# Patient Record
Sex: Male | Born: 1955 | Race: Black or African American | Hispanic: No | Marital: Single | Smoking: Former smoker
Health system: Southern US, Community
[De-identification: ages and names within clinical notes are randomized; demographics above are authoritative.]

## PROBLEM LIST (undated history)

## (undated) DIAGNOSIS — E119 Type 2 diabetes mellitus without complications: Secondary | ICD-10-CM

## (undated) DIAGNOSIS — K219 Gastro-esophageal reflux disease without esophagitis: Secondary | ICD-10-CM

## (undated) DIAGNOSIS — J302 Other seasonal allergic rhinitis: Secondary | ICD-10-CM

## (undated) DIAGNOSIS — Z8781 Personal history of (healed) traumatic fracture: Secondary | ICD-10-CM

## (undated) DIAGNOSIS — N400 Enlarged prostate without lower urinary tract symptoms: Secondary | ICD-10-CM

## (undated) DIAGNOSIS — F1911 Other psychoactive substance abuse, in remission: Secondary | ICD-10-CM

## (undated) DIAGNOSIS — N529 Male erectile dysfunction, unspecified: Secondary | ICD-10-CM

## (undated) DIAGNOSIS — M1990 Unspecified osteoarthritis, unspecified site: Secondary | ICD-10-CM

## (undated) DIAGNOSIS — G8929 Other chronic pain: Secondary | ICD-10-CM

## (undated) DIAGNOSIS — Z972 Presence of dental prosthetic device (complete) (partial): Secondary | ICD-10-CM

## (undated) DIAGNOSIS — D573 Sickle-cell trait: Secondary | ICD-10-CM

## (undated) DIAGNOSIS — Z8719 Personal history of other diseases of the digestive system: Secondary | ICD-10-CM

## (undated) DIAGNOSIS — M549 Dorsalgia, unspecified: Secondary | ICD-10-CM

## (undated) DIAGNOSIS — Z9229 Personal history of other drug therapy: Secondary | ICD-10-CM

## (undated) DIAGNOSIS — H409 Unspecified glaucoma: Secondary | ICD-10-CM

## (undated) DIAGNOSIS — E785 Hyperlipidemia, unspecified: Secondary | ICD-10-CM

## (undated) DIAGNOSIS — Z86010 Personal history of colonic polyps: Secondary | ICD-10-CM

## (undated) DIAGNOSIS — Z832 Family history of diseases of the blood and blood-forming organs and certain disorders involving the immune mechanism: Secondary | ICD-10-CM

## (undated) DIAGNOSIS — M5030 Other cervical disc degeneration, unspecified cervical region: Secondary | ICD-10-CM

## (undated) DIAGNOSIS — K642 Third degree hemorrhoids: Secondary | ICD-10-CM

## (undated) DIAGNOSIS — F1021 Alcohol dependence, in remission: Secondary | ICD-10-CM

## (undated) DIAGNOSIS — D509 Iron deficiency anemia, unspecified: Secondary | ICD-10-CM

## (undated) DIAGNOSIS — Z860101 Personal history of adenomatous and serrated colon polyps: Secondary | ICD-10-CM

## (undated) DIAGNOSIS — K08109 Complete loss of teeth, unspecified cause, unspecified class: Secondary | ICD-10-CM

## (undated) DIAGNOSIS — I10 Essential (primary) hypertension: Secondary | ICD-10-CM

## (undated) DIAGNOSIS — Z8669 Personal history of other diseases of the nervous system and sense organs: Secondary | ICD-10-CM

## (undated) HISTORY — DX: Gastro-esophageal reflux disease without esophagitis: K21.9

## (undated) HISTORY — PX: APPENDECTOMY: SHX54

## (undated) HISTORY — PX: ESOPHAGOGASTRODUODENOSCOPY: SHX1529

## (undated) HISTORY — DX: Type 2 diabetes mellitus without complications: E11.9

## (undated) HISTORY — PX: COLONOSCOPY: SHX174

## (undated) HISTORY — DX: Unspecified osteoarthritis, unspecified site: M19.90

---

## 1999-11-08 ENCOUNTER — Emergency Department (HOSPITAL_COMMUNITY): Admission: EM | Admit: 1999-11-08 | Discharge: 1999-11-08 | Payer: Self-pay | Admitting: Emergency Medicine

## 1999-11-08 ENCOUNTER — Encounter: Payer: Self-pay | Admitting: Emergency Medicine

## 2001-05-09 ENCOUNTER — Emergency Department (HOSPITAL_COMMUNITY): Admission: EM | Admit: 2001-05-09 | Discharge: 2001-05-09 | Payer: Self-pay | Admitting: Emergency Medicine

## 2005-01-16 ENCOUNTER — Emergency Department (HOSPITAL_COMMUNITY): Admission: EM | Admit: 2005-01-16 | Discharge: 2005-01-16 | Payer: Self-pay | Admitting: Emergency Medicine

## 2007-01-16 HISTORY — PX: OTHER SURGICAL HISTORY: SHX169

## 2007-07-16 DIAGNOSIS — Z8781 Personal history of (healed) traumatic fracture: Secondary | ICD-10-CM

## 2007-07-16 HISTORY — DX: Personal history of (healed) traumatic fracture: Z87.81

## 2007-07-27 ENCOUNTER — Emergency Department (HOSPITAL_COMMUNITY): Admission: EM | Admit: 2007-07-27 | Discharge: 2007-07-27 | Payer: Self-pay | Admitting: Emergency Medicine

## 2007-08-04 ENCOUNTER — Emergency Department (HOSPITAL_COMMUNITY): Admission: EM | Admit: 2007-08-04 | Discharge: 2007-08-04 | Payer: Self-pay | Admitting: Emergency Medicine

## 2007-08-05 ENCOUNTER — Emergency Department (HOSPITAL_COMMUNITY): Admission: EM | Admit: 2007-08-05 | Discharge: 2007-08-05 | Payer: Self-pay | Admitting: Emergency Medicine

## 2007-08-05 ENCOUNTER — Ambulatory Visit (HOSPITAL_COMMUNITY): Admission: RE | Admit: 2007-08-05 | Discharge: 2007-08-05 | Payer: Self-pay | Admitting: Specialist

## 2007-08-19 ENCOUNTER — Emergency Department (HOSPITAL_COMMUNITY): Admission: EM | Admit: 2007-08-19 | Discharge: 2007-08-19 | Payer: Self-pay | Admitting: Emergency Medicine

## 2007-09-03 ENCOUNTER — Encounter: Payer: Self-pay | Admitting: Physician Assistant

## 2007-09-03 ENCOUNTER — Emergency Department (HOSPITAL_COMMUNITY): Admission: EM | Admit: 2007-09-03 | Discharge: 2007-09-03 | Payer: Self-pay | Admitting: Emergency Medicine

## 2007-09-03 LAB — CONVERTED CEMR LAB
Basophils Relative: 1 %
Eosinophils Relative: 1 %
HCT: 36 %
Hemoglobin: 11.5 g/dL
Lymphocytes Relative: 32 %
MCHC: 31.9 g/dL
MCV: 71.4 fL
Monocytes Relative: 6 %
Neutrophils Relative %: 60 %
Platelets: 222 10*3/uL
RBC: 5.04 M/uL
RDW: 17.8 %
WBC: 8 10*3/uL

## 2008-09-24 ENCOUNTER — Ambulatory Visit: Payer: Self-pay | Admitting: Physician Assistant

## 2008-09-24 DIAGNOSIS — K047 Periapical abscess without sinus: Secondary | ICD-10-CM | POA: Insufficient documentation

## 2008-09-24 DIAGNOSIS — S0230XA Fracture of orbital floor, unspecified side, initial encounter for closed fracture: Secondary | ICD-10-CM | POA: Insufficient documentation

## 2008-09-24 DIAGNOSIS — M479 Spondylosis, unspecified: Secondary | ICD-10-CM | POA: Insufficient documentation

## 2008-09-24 DIAGNOSIS — D179 Benign lipomatous neoplasm, unspecified: Secondary | ICD-10-CM | POA: Insufficient documentation

## 2008-09-24 DIAGNOSIS — Z8669 Personal history of other diseases of the nervous system and sense organs: Secondary | ICD-10-CM | POA: Insufficient documentation

## 2008-09-24 DIAGNOSIS — R079 Chest pain, unspecified: Secondary | ICD-10-CM | POA: Insufficient documentation

## 2008-09-24 DIAGNOSIS — Z9089 Acquired absence of other organs: Secondary | ICD-10-CM | POA: Insufficient documentation

## 2008-09-27 ENCOUNTER — Telehealth: Payer: Self-pay | Admitting: Physician Assistant

## 2008-09-28 ENCOUNTER — Encounter: Payer: Self-pay | Admitting: Physician Assistant

## 2008-09-28 ENCOUNTER — Telehealth: Payer: Self-pay | Admitting: Physician Assistant

## 2008-09-28 LAB — CONVERTED CEMR LAB
ALT: 12 units/L (ref 0–53)
AST: 13 units/L (ref 0–37)
Albumin: 4.6 g/dL (ref 3.5–5.2)
Alkaline Phosphatase: 78 units/L (ref 39–117)
BUN: 12 mg/dL (ref 6–23)
Basophils Absolute: 0 10*3/uL (ref 0.0–0.1)
Basophils Relative: 0 % (ref 0–1)
CO2: 23 meq/L (ref 19–32)
Calcium: 9.3 mg/dL (ref 8.4–10.5)
Chloride: 106 meq/L (ref 96–112)
Cholesterol: 198 mg/dL (ref 0–200)
Creatinine, Ser: 0.75 mg/dL (ref 0.40–1.50)
Eosinophils Absolute: 0.1 10*3/uL (ref 0.0–0.7)
Eosinophils Relative: 1 % (ref 0–5)
Glucose, Bld: 87 mg/dL (ref 70–99)
HCT: 33.2 % — ABNORMAL LOW (ref 39.0–52.0)
HDL: 46 mg/dL (ref >39)
Hemoglobin: 11.6 g/dL — ABNORMAL LOW (ref 13.0–17.0)
LDL Cholesterol: 124 mg/dL — ABNORMAL HIGH (ref 0–99)
Lymphocytes Relative: 21 % (ref 12–46)
Lymphs Abs: 3.1 10*3/uL (ref 0.7–4.0)
MCHC: 34.9 g/dL (ref 30.0–36.0)
MCV: 60.7 fL — ABNORMAL LOW (ref 78.0–100.0)
Monocytes Absolute: 1.1 10*3/uL — ABNORMAL HIGH (ref 0.1–1.0)
Monocytes Relative: 7 % (ref 3–12)
Neutro Abs: 10.1 10*3/uL — ABNORMAL HIGH (ref 1.7–7.7)
Neutrophils Relative %: 71 % (ref 43–77)
PSA: 0.59 ng/mL (ref 0.10–4.00)
Platelets: 212 10*3/uL (ref 150–400)
Potassium: 4.3 meq/L (ref 3.5–5.3)
RBC: 5.47 M/uL (ref 4.22–5.81)
RDW: 17 % — ABNORMAL HIGH (ref 11.5–15.5)
Sodium: 143 meq/L (ref 135–145)
TSH: 0.844 microintl units/mL (ref 0.350–4.500)
Total Bilirubin: 1 mg/dL (ref 0.3–1.2)
Total CHOL/HDL Ratio: 4.3
Total Protein: 7.5 g/dL (ref 6.0–8.3)
Triglycerides: 140 mg/dL (ref <150)
VLDL: 28 mg/dL (ref 0–40)
WBC: 14.4 10*3/uL — ABNORMAL HIGH (ref 4.0–10.5)

## 2008-09-29 ENCOUNTER — Ambulatory Visit: Payer: Self-pay | Admitting: Physician Assistant

## 2008-09-30 ENCOUNTER — Encounter: Payer: Self-pay | Admitting: Physician Assistant

## 2008-10-15 DIAGNOSIS — D582 Other hemoglobinopathies: Secondary | ICD-10-CM | POA: Insufficient documentation

## 2008-10-15 LAB — CONVERTED CEMR LAB
Iron: 85 ug/dL (ref 42–165)
Retic Ct Pct: 1.4 % (ref 0.4–3.1)
Saturation Ratios: 32 % (ref 20–55)
TIBC: 267 ug/dL (ref 215–435)
UIBC: 182 ug/dL

## 2008-10-25 ENCOUNTER — Ambulatory Visit: Payer: Self-pay | Admitting: Physician Assistant

## 2008-10-25 DIAGNOSIS — N400 Enlarged prostate without lower urinary tract symptoms: Secondary | ICD-10-CM | POA: Insufficient documentation

## 2008-10-25 LAB — CONVERTED CEMR LAB
Bilirubin Urine: NEGATIVE
Blood in Urine, dipstick: NEGATIVE
Glucose, Urine, Semiquant: NEGATIVE
Ketones, urine, test strip: NEGATIVE
Nitrite: NEGATIVE
Protein, U semiquant: NEGATIVE
Specific Gravity, Urine: >=1.03
Urobilinogen, UA: 0.2
WBC Urine, dipstick: NEGATIVE
pH: 5

## 2008-10-27 ENCOUNTER — Encounter: Payer: Self-pay | Admitting: Physician Assistant

## 2008-10-28 ENCOUNTER — Telehealth: Payer: Self-pay | Admitting: Physician Assistant

## 2008-10-29 ENCOUNTER — Encounter (INDEPENDENT_AMBULATORY_CARE_PROVIDER_SITE_OTHER): Payer: Self-pay | Admitting: General Surgery

## 2008-10-29 ENCOUNTER — Ambulatory Visit (HOSPITAL_COMMUNITY): Admission: RE | Admit: 2008-10-29 | Discharge: 2008-10-29 | Payer: Self-pay | Admitting: General Surgery

## 2008-10-29 HISTORY — PX: LIPOMA EXCISION: SHX5283

## 2008-11-01 ENCOUNTER — Telehealth: Payer: Self-pay | Admitting: Physician Assistant

## 2008-11-02 ENCOUNTER — Encounter: Payer: Self-pay | Admitting: Physician Assistant

## 2008-11-15 ENCOUNTER — Encounter: Admission: RE | Admit: 2008-11-15 | Discharge: 2008-12-27 | Payer: Self-pay | Admitting: Physician Assistant

## 2008-12-28 ENCOUNTER — Encounter: Payer: Self-pay | Admitting: Physician Assistant

## 2008-12-30 ENCOUNTER — Encounter: Payer: Self-pay | Admitting: Physician Assistant

## 2009-01-27 ENCOUNTER — Ambulatory Visit: Payer: Self-pay | Admitting: Physician Assistant

## 2009-01-28 ENCOUNTER — Encounter (INDEPENDENT_AMBULATORY_CARE_PROVIDER_SITE_OTHER): Payer: Self-pay | Admitting: *Deleted

## 2009-03-04 ENCOUNTER — Encounter (INDEPENDENT_AMBULATORY_CARE_PROVIDER_SITE_OTHER): Payer: Self-pay | Admitting: *Deleted

## 2009-03-04 ENCOUNTER — Ambulatory Visit: Payer: Self-pay | Admitting: Gastroenterology

## 2009-03-16 ENCOUNTER — Encounter: Payer: Self-pay | Admitting: Physician Assistant

## 2009-03-16 ENCOUNTER — Telehealth: Payer: Self-pay | Admitting: Gastroenterology

## 2009-03-18 ENCOUNTER — Ambulatory Visit: Payer: Self-pay | Admitting: Gastroenterology

## 2009-03-23 ENCOUNTER — Encounter: Payer: Self-pay | Admitting: Physician Assistant

## 2009-04-18 ENCOUNTER — Ambulatory Visit (HOSPITAL_COMMUNITY): Admission: RE | Admit: 2009-04-18 | Discharge: 2009-04-18 | Payer: Self-pay | Admitting: Neurosurgery

## 2009-04-21 ENCOUNTER — Ambulatory Visit (HOSPITAL_COMMUNITY): Admission: RE | Admit: 2009-04-21 | Discharge: 2009-04-21 | Payer: Self-pay | Admitting: Neurosurgery

## 2009-05-02 ENCOUNTER — Encounter: Payer: Self-pay | Admitting: Physician Assistant

## 2009-05-13 ENCOUNTER — Encounter: Payer: Self-pay | Admitting: Physician Assistant

## 2009-05-18 ENCOUNTER — Ambulatory Visit: Payer: Self-pay | Admitting: Physician Assistant

## 2009-05-23 ENCOUNTER — Inpatient Hospital Stay (HOSPITAL_COMMUNITY): Admission: RE | Admit: 2009-05-23 | Discharge: 2009-05-24 | Payer: Self-pay | Admitting: Neurosurgery

## 2009-05-23 HISTORY — PX: ANTERIOR CERVICAL DECOMP/DISCECTOMY FUSION: SHX1161

## 2009-06-17 ENCOUNTER — Encounter: Payer: Self-pay | Admitting: Physician Assistant

## 2009-07-01 ENCOUNTER — Encounter: Payer: Self-pay | Admitting: Physician Assistant

## 2009-07-20 ENCOUNTER — Encounter: Payer: Self-pay | Admitting: Physician Assistant

## 2009-08-05 ENCOUNTER — Encounter: Payer: Self-pay | Admitting: Physician Assistant
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
## 2009-09-13 ENCOUNTER — Telehealth: Payer: Self-pay | Admitting: Physician Assistant

## 2009-10-18 ENCOUNTER — Ambulatory Visit: Payer: Self-pay | Admitting: Physician Assistant

## 2009-10-18 DIAGNOSIS — N529 Male erectile dysfunction, unspecified: Secondary | ICD-10-CM | POA: Insufficient documentation

## 2009-10-18 LAB — CONVERTED CEMR LAB
Bilirubin Urine: NEGATIVE
Blood in Urine, dipstick: NEGATIVE
Glucose, Urine, Semiquant: NEGATIVE
Ketones, urine, test strip: NEGATIVE
Nitrite: NEGATIVE
PSA: 0.49 ng/mL (ref 0.10–4.00)
Specific Gravity, Urine: >=1.03
Urobilinogen, UA: 0.2
WBC Urine, dipstick: NEGATIVE
pH: 6

## 2009-10-20 ENCOUNTER — Encounter: Payer: Self-pay | Admitting: Physician Assistant

## 2010-02-05 ENCOUNTER — Encounter: Payer: Self-pay | Admitting: Neurosurgery

## 2010-02-14 NOTE — Progress Notes (Signed)
  Phone Note Other Incoming   Reason for Call: Discuss lab or test results Summary of Call: Patient needs further labs Please add on to blood in lab:   iron, TIBC, ferritin, Retic. count and hemoglobin electrophoresis Make sure patient does stool cards  Initial call taken by: Brynda Rim,  September 28, 2008 10:16 PM  Follow-up for Phone Call        pt came today to do labs Follow-up by: Armenia Shannon,  September 29, 2008 10:38 AM

## 2010-02-14 NOTE — Letter (Signed)
Summary: *HSN Results Follow up  [facility]  [address] [place], [place] [address]   Phone: [phone]  Fax: [phone]      10/20/2009   Xavier Campbell [address] [place], [place]  [address]   Dear  Xavier Campbell,                            ____S.Drinkard,FNP   ____D. Gore,FNP       ____B. McPherson,MD   ____V. Rankins,MD    ____E. Mulberry,MD    ____N. Daphine Deutscher, FNP  ____D. Reche Dixon, MD    ____K. Philipp Deputy, MD    __x__S. Alben Spittle, PA-C     This letter is to inform you that your recent test(s):  _______Pap Smear    ___x____Lab Test     _______X-ray    ___x____ is within acceptable limits  _______ requires a medication change  _______ requires a follow-up lab visit  _______ requires a follow-up visit with your provider   Comments: Prostate blood test is normal.       _________________________________________________________ If you have any questions, please contact our office                     Sincerely,  Tereso Newcomer PA-C [facility]

## 2010-02-14 NOTE — Letter (Signed)
Summary: DENTAL REFERRAL  DENTAL REFERRAL   Imported By: Arta Bruce 09/24/2008 13:02:33  _____________________________________________________________________  External Attachment:    Type:   Image     Comment:   External Document

## 2010-02-14 NOTE — Letter (Signed)
Summary: [facility]  [facility]   Imported By: Arta Bruce 07/04/2009 10:40:10  _____________________________________________________________________  External Attachment:    Type:   Image     Comment:   External Document

## 2010-02-14 NOTE — Letter (Signed)
Summary: BASIC NEEDS BAG  BASIC NEEDS BAG   Imported By: Arta Bruce 09/24/2008 15:07:38  _____________________________________________________________________  External Attachment:    Type:   Image     Comment:   External Document

## 2010-02-14 NOTE — Progress Notes (Signed)
Summary: pharmacy change  Phone Note Call from Patient Call back at Home Phone [phone]   Caller: Patient Call For: Dr. Arlyce Dice Reason for Call: Talk to Nurse Summary of Call: pt says he gave the wrong pharmacy for his prep... correct pharmacy is [facility] on [address] Initial call taken by: Vallarie Mare,  March 16, 2009 9:11 AM  Follow-up for Phone Call        Spoke with pt.  He found the written Rx I gave him at his PV. Follow-up by: Wyona Almas RN,  March 16, 2009 9:15 AM

## 2010-02-14 NOTE — Procedures (Signed)
Summary: Colonoscopy  Patient: [name] Note: All result statuses are Final unless otherwise noted.  Tests: (1) Colonoscopy (COL)   COL Colonoscopy           DONE     [facility]     [address]     [place], [place]  [address]           COLONOSCOPY PROCEDURE REPORT           PATIENT:  Xavier Campbell, Xavier Campbell  MR#:  [id number]     BIRTHDATE:  April 03, 1955, 53 yrs. old  GENDER:  male           ENDOSCOPIST:  Barbette Hair. Arlyce Dice, MD     Referred by:           PROCEDURE DATE:  03/18/2009     PROCEDURE:  Colonoscopy, Diagnostic     ASA CLASS:  Class I     INDICATIONS:  Routine Risk Screening           MEDICATIONS:   Fentanyl 100 mcg IV, Versed 10 mg IV           DESCRIPTION OF PROCEDURE:   After the risks benefits and     alternatives of the procedure were thoroughly explained, informed     consent was obtained.  Digital rectal exam was performed and     revealed no abnormalities.   The LB [id number] endoscope     was introduced through the anus and advanced to the cecum, which     was identified by both the appendix and ileocecal valve, without     limitations.  The quality of the prep was adequate, using     MoviPrep.  The instrument was then slowly withdrawn as the colon     was fully examined.     <<PROCEDUREIMAGES>>           FINDINGS:  A normal appearing cecum, ileocecal valve, and     appendiceal orifice were identified. The ascending, hepatic     flexure, transverse, splenic flexure, descending, sigmoid colon,     and rectum appeared unremarkable (see image1, image2, image4,     image5, image6, image7, image8, image9, and image10).     Retroflexed views in the rectum revealed no abnormalities.    The     scope was then withdrawn from the patient and the procedure     completed.           COMPLICATIONS:  None           ENDOSCOPIC IMPRESSION:     1) Normal colon     RECOMMENDATIONS:     1) Continue current colorectal screening recommendations for  "routine risk" patients with a repeat colonoscopy in 10 years.           REPEAT EXAM:  In 10 year(s) for Colonoscopy.           ______________________________     Barbette Hair. Arlyce Dice, MD           CC:  Tereso Newcomer PA           n.     eSIGNED:   Barbette Hair. Lidia Clavijo at 03/18/2009 11:35 AM           Xavier Campbell, [id number]  Note: An exclamation mark (!) indicates a result that was not dispersed into the flowsheet. Document Creation Date: 03/18/2009 11:35 AM _______________________________________________________________________  (1) Order result status: Final Collection or observation date-time: 03/18/2009 11:29 Requested  date-time:  Receipt date-time:  Reported date-time:  Referring Physician:   Ordering Physician: Melvia Heaps [phone]) Specimen Source:  Source: Xavier Campbell Order Number: [id number] Lab site:   Appended Document: Colonoscopy    Clinical Lists Changes  Observations: Added new observation of COLONNXTDUE: 03/2019 (03/18/2009 12:51)

## 2010-02-14 NOTE — Progress Notes (Signed)
Summary: med refill  Phone Note Call from Patient   Summary of Call: Pt needs Flomax called to [facility] pharmacy because it cost over $70 at [facility].  Pt can be reached at [phone]. Initial call taken by: Vesta Mixer CMA,  October 28, 2008 10:46 AM  Follow-up for Phone Call        notified pt that med got called to [facility] pharmacy Follow-up by: Armenia Shannon,  October 29, 2008 4:39 PM

## 2010-02-14 NOTE — Letter (Signed)
Summary: MAILED REQUESTED RECORDS TO [facility]  MAILED REQUESTED RECORDS TO [facility]   Imported By: Arta Bruce 08/05/2009 12:08:32  _____________________________________________________________________  External Attachment:    Type:   Image     Comment:   External Document

## 2010-02-14 NOTE — Miscellaneous (Signed)
Summary: Eval by Dr. 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  competed PT 12.13.2010 after all goals met for neck pain    b.  CT in 2009 with mulilevel DJD (see note from 01/27/2009 for details)    c.  eval by Dr. 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 s/p excision with Dr. 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  competed PT 12.13.2010 after all goals met for neck pain    b.  CT in 2009 with mulilevel DJD (see note from 01/27/2009 for details)    c.  eval by Dr. 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 s/p excision with Dr. 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

## 2010-02-14 NOTE — Letter (Signed)
Summary: Previsit letter  [facility]  [address] [place], [place] [address]   Phone: [phone]  Fax: [phone]       01/28/2009 MRN: [id number]  [facility] [address] [place], [place]  [address]  Dear Mr. Lo,  Welcome to the Gastroenterology Division at [facility].    You are scheduled to see a nurse for your pre-procedure visit on 03-04-09 at 3:30pm on the 3rd floor at [facility], [address]  We ask that you try to arrive at our office 15 minutes prior to your appointment time to allow for check-in.  Your nurse visit will consist of discussing your medical and surgical history, your immediate family medical history, and your medications.    Please bring a complete list of all your medications or, if you prefer, bring the medication bottles and we will list them.  We will need to be aware of both prescribed and over the counter drugs.  We will need to know exact dosage information as well.  If you are on blood thinners (Coumadin, Plavix, Aggrenox, Ticlid, etc.) please call our office today/prior to your appointment, as we need to consult with your physician about holding your medication.   Please be prepared to read and sign documents such as consent forms, a financial agreement, and acknowledgement forms.  If necessary, and with your consent, a friend or relative is welcome to sit-in on the nurse visit with you.  Please bring your insurance card so that we may make a copy of it.  If your insurance requires a referral to see a specialist, please bring your referral form from your primary care physician.  No co-pay is required for this nurse visit.     If you cannot keep your appointment, please call [phone] to cancel or reschedule prior to your appointment date.  This allows [place] the opportunity to schedule an appointment for another patient in need of care.    Thank you for choosing [facility] for your medical needs.  We  appreciate the opportunity to care for you.  Please visit [place] at our website  to learn more about our practice.                     Sincerely.                                                                                                                   The Gastroenterology Division

## 2010-02-14 NOTE — Miscellaneous (Signed)
Summary: [facility] Previsit/prep  Clinical Lists Changes  Medications: Added new medication of DULCOLAX 5 MG  TBEC (BISACODYL) Day before procedure take 2 at 3pm and 2 at 8pm. - Signed Added new medication of METOCLOPRAMIDE HCL 10 MG  TABS (METOCLOPRAMIDE HCL) As per prep instructions. - Signed Added new medication of MIRALAX   POWD (POLYETHYLENE GLYCOL 3350) As per prep  instructions. - Signed Rx of DULCOLAX 5 MG  TBEC (BISACODYL) Day before procedure take 2 at 3pm and 2 at 8pm.;  #4 x 0;  Signed;  Entered by: Wyona Almas RN;  Authorized by: Louis Meckel MD;  Method used: Print then Give to Patient Rx of METOCLOPRAMIDE HCL 10 MG  TABS (METOCLOPRAMIDE HCL) As per prep instructions.;  #2 x 0;  Signed;  Entered by: Wyona Almas RN;  Authorized by: Louis Meckel MD;  Method used: Print then Give to Patient Rx of MIRALAX   POWD (POLYETHYLENE GLYCOL 3350) As per prep  instructions.;  #255gm x 0;  Signed;  Entered by: Wyona Almas RN;  Authorized by: Louis Meckel MD;  Method used: Print then Give to Patient Observations: Added new observation of ALLERGY REV: Done (03/04/2009 14:51) Added new observation of NKA: T (03/04/2009 14:51)    Prescriptions: MIRALAX   POWD (POLYETHYLENE GLYCOL 3350) As per prep  instructions.  #255gm x 0   Entered by:   Wyona Almas RN   Authorized by:   Louis Meckel MD   Signed by:   Wyona Almas RN on 03/04/2009   Method used:   Print then Give to Patient   RxID:   [id number] METOCLOPRAMIDE HCL 10 MG  TABS (METOCLOPRAMIDE HCL) As per prep instructions.  #2 x 0   Entered by:   Wyona Almas RN   Authorized by:   Louis Meckel MD   Signed by:   Wyona Almas RN on 03/04/2009   Method used:   Print then Give to Patient   RxID:   [id number] DULCOLAX 5 MG  TBEC (BISACODYL) Day before procedure take 2 at 3pm and 2 at 8pm.  #4 x 0   Entered by:   Wyona Almas RN   Authorized by:   Louis Meckel MD   Signed by:   Wyona Almas RN on  03/04/2009   Method used:   Print then Give to Patient   RxID:   [id number]

## 2010-02-14 NOTE — Assessment & Plan Note (Signed)
Summary: 3 month fu for bhp///kt   Vital Signs:  Patient profile:   55 year old male Height:      72 inches Weight:      144 pounds Temp:     97.6 degrees F oral Pulse rate:   63 / minute Pulse rhythm:   regular Resp:     18 per minute BP sitting:   99 / 65  (left arm) Cuff size:   regular  Vitals Entered By: [place] Shannon (May 18, 2009 11:36 AM) CC: three month f/u... pt says he wants you to know he is going to have surgery on Monday... pt wants to know will you give him pain meds or the surgeon.. Is Patient Diabetic? No Pain Assessment Patient in pain? no       Does patient need assistance? Functional Status Self care Ambulation Normal   Primary Care Provider:  Tereso Newcomer PA-C  CC:  three month f/u... pt says he wants you to know he is going to have surgery on Monday... pt wants to know will you give him pain meds or the surgeon...  History of Present Illness: Here for follow up on BPH. Notes significant improvement in symptoms.  Denies any further nocturia.  Has some urinary frequency sitll, but much less than before taking the Flomax.  Only occurs about 3 x per week.  Was occurring daily before the Flomax.  No dysuria or hematuria.  Denies suprapubic pain or discomfort.  Neck DDD:  He is to have surgery next week with Dr. Phoebe Perch.   Problems Prior to Update: 1)  Hypertrophy Prostate w/o Ur Obst & Oth Luts  (ICD-600.00) 2)  Chest Pain Unspecified  (ICD-786.50) 3)  Preventive Health Care  (ICD-V70.0) 4)  Osteoarthritis, Cervical Spine  (ICD-721.90) 5)  Lipoma  (ICD-214.9) 6)  Abscess, Tooth  (ICD-522.5) 7)  Other Hemoglobinopathies  (ICD-282.7) 8)  Guillain-barre Syndrome, Hx of  (ICD-V12.49) 9)  Appendectomy, Hx of  (ICD-V45.79) 10)  Orbital Floor , Closed Fracture  (ICD-802.6)  Current Medications (verified): 1)  Naprosyn 500 Mg Tabs (Naproxen) .... Take 1 Tablet By Mouth Two Times A Day As Needed For Pain 2)  Flomax 0.4 Mg Caps (Tamsulosin Hcl) .... Take 1  Tab By Mouth At Bedtime 3)  Flexeril 10 Mg Tabs (Cyclobenzaprine Hcl) .... 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  competed PT 12.13.2010 after all goals met for neck pain    b.  CT in 2009 with mulilevel DJD (see note from 01/27/2009 for details)    c.  eval by Dr. 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 s/p excision with Dr. 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   Head:  normocephalic and atraumatic.   Neck:  supple.   Lungs:  normal breath sounds.   Heart:  normal rate and regular rhythm.   Neurologic:  alert & oriented X3 and cranial nerves II-XII  intact.   Psych:  normally interactive.     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 Phoebe Perch on 05/23/2009  Complete Medication List: 1)  Naprosyn 500 Mg Tabs (Naproxen) .... Take 1 tablet by mouth two times a day as needed for pain 2)  Flomax 0.4 Mg Caps (Tamsulosin hcl) .... Take 1 tab by  mouth at bedtime 3)  Flexeril 10 Mg Tabs (Cyclobenzaprine hcl) .... Take 1 tablet by mouth three times a day as needed for neck spasms  Patient Instructions: 1)  Schedule CPE in October 2011 with Keniesha Adderly.  Return sooner as needed.

## 2010-02-14 NOTE — Miscellaneous (Signed)
Summary: DISCHARGE NOTE FOR NECK PAIN  DISCHARGE NOTE FOR NECK PAIN   Imported By: Arta Bruce 02/15/2009 14:40:00  _____________________________________________________________________  External Attachment:    Type:   Image     Comment:   External Document

## 2010-02-14 NOTE — Assessment & Plan Note (Signed)
Summary: CPE/////KT   Vital Signs:  Patient profile:   55 year old male Height:      72 inches Weight:      141.8 pounds BMI:     19.30 Temp:     97.1 degrees F oral Pulse rate:   68 / minute Pulse rhythm:   regular Resp:     18 per minute BP sitting:   110 / 70  (left arm)  Vitals Entered By: CMA Student Linzie Collin CC: office visit CPE Is Patient Diabetic? No Pain Assessment Patient in pain? no       Does patient need assistance? Functional Status Self care Ambulation Normal   Primary Care Provider:  Tereso Newcomer PA-C  CC:  office visit CPE.  History of Present Illness: Here for CPE.  Cervical DDD:  Had surgery with Dr.  Phoebe Perch in May 2011.  Has recently noted shooting pain in right arm and then numbness. Does not point to one particular dermatome.  Called neurosurgeon.  Was told to monitor and follow up if no better.  Has been going on for a month now.  Encouraged him to follow up with neurosurgeon with these symptoms.  ED:  Has trouble getting erections for about a year.  No AM erections.  No loss of sensation.  Denies depression.  Is in a relationship.  Would like to try viagra.  Health maint: Up to date on vaccines. Needs flu shot. Wants PSA. PHQ9=2.  Denies depression. Had colo this year.   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 Take 1 Tablet By Mouth Two Times A Day As Needed For Pain (Take With Food) 2)  Flomax 0.4 Mg Caps  (Tamsulosin Hcl) .... Take 1 Tab By Mouth At Bedtime 3)  Flexeril 10 Mg Tabs (Cyclobenzaprine Hcl) .... Take 1 Tablet By Mouth Three Times A Day As Needed For Neck Spasms  Allergies (verified): No Known Drug Allergies  Past History:  Past Surgical History: Last updated: 07/01/2009 Lipoma excised 10/2008 Cervical fusion (5.2011)   a. Dr. Phoebe Perch  Past Medical History: Current Problems:  HYPERTROPHY PROSTATE W/O UR OBST & OTH LUTS (ICD-600.00) CHEST PAIN UNSPECIFIED (ICD-786.50) PREVENTIVE HEALTH CARE (ICD-V70.0) OSTEOARTHRITIS, CERVICAL SPINE (ICD-721.90)    a.  competed PT 12.13.2010 after all goals met for neck pain    b.  CT in 2009 with mulilevel DJD (see note from 01/27/2009 for details)    c.  eval by Dr. Phoebe Perch 3.2.2011:  surgery done 5/2011I LIPOMA (ICD-214.9)    a. s/p excision with Dr. 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 eye dr. Nadyne Coombes, MD [phone])  Family History: Reviewed history from 09/24/2008 and no changes required. 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 Eyes:  saw opthalmology recently. CV:  Denies chest pain or discomfort and shortness of breath with exertion. Resp:  Denies cough. GI:  Denies bloody stools and dark tarry stools. GU:  Denies dysuria and urinary frequency. MS:  See HPI. Neuro:  See HPI. Psych:  Denies depression.  Physical Exam  General:  alert, well-developed, and well-nourished.   Head:   normocephalic and atraumatic.   Eyes:  pupils equal, pupils round, pupils reactive to light, and no optic disk abnormalities.   Ears:  R ear normal and L ear normal.   Nose:  no external deformity.   Mouth:  pharynx pink and moist, no erythema, no exudates, and poor dentition.   Neck:  supple, no thyromegaly, no JVD, no carotid bruits, and no cervical lymphadenopathy.   Chest Wall:  no deformities.   Breasts:  no gynecomastia.   Lungs:  normal respiratory effort, normal breath sounds, no crackles, and no wheezes.   Heart:  normal rate, regular rhythm, and no murmur.   Abdomen:  soft, non-tender, normal bowel sounds, and no hepatomegaly.   Rectal:  normal sphincter tone, no masses, no tenderness, no fissures, no fistulae, and external hemorrhoid(s).   Genitalia:  circumcised, no hydrocele, no varicocele, no scrotal masses, no testicular masses or atrophy, no cutaneous lesions, and no urethral discharge.   Prostate:  no nodules, no asymmetry, and 1+ enlarged.   Msk:  normal ROM.   Pulses:  R posterior tibial normal, R dorsalis pedis normal, L posterior tibial normal, and L dorsalis pedis normal.   Extremities:  no edema Neurologic:  alert & oriented X3, cranial nerves II-XII intact, strength normal in all extremities, and DTRs symmetrical and normal.   Skin:  turgor normal.   Psych:  normally interactive and good eye contact.     Impression & Recommendations:  Problem # 1:  ORGANIC IMPOTENCE (ICD-607.84)  trial of viagra  His updated medication list for this problem includes:    Viagra 50 Mg Tabs (Sildenafil citrate) .Marland Kitchen... Take one tablet by mouth 30 mins to 1 hour before activity; do not take more than one tablet in 24 hours  Orders: T-PSA ([id number])  Problem # 2:  OSTEOARTHRITIS, CERVICAL SPINE (ICD-721.90) with right arm symptoms, he really should f/u with the neurosurgeon for eval  Problem # 3:  HYPERTROPHY PROSTATE W/O UR OBST & OTH LUTS (ICD-600.00) Assessment:  Improved  contine flomax  Orders: T-PSA ([id number])  Problem # 4:  PREVENTIVE HEALTH CARE (ICD-V70.0) flu shot   Orders: UA Dipstick w/o Micro (manual) (30865) T-PSA ([id number])  Complete Medication List: 1)  Naprosyn 500 Mg Tabs (Naproxen) .... Take 1 tablet by mouth two times a day as needed for pain (take with food) 2)  Flomax 0.4 Mg Caps (Tamsulosin hcl) .... Take 1 tab by mouth at bedtime 3)  Flexeril 10 Mg Tabs (Cyclobenzaprine hcl) .... Take 1 tablet by mouth three times a day as needed for neck spasms 4)  Travatan Z 0.004 % Soln (Travoprost) .Marland Kitchen.. 1 drop each eye at bedtime 5)  Viagra 50 Mg Tabs (Sildenafil citrate) .... Take one tablet by mouth 30 mins to 1 hour before activity; do not take more than one tablet in 24 hours  Patient Instructions: 1)  You  can take the Naprosyn (naproxen) every 12 hours as needed for pain. 2)  You can also take Tylenol (Acetminophen) 500 mg 1-2 tablets every 6 hours as needed for pain. 3)  You need to call Dr. Phoebe Perch for follow up to evaluate your right arm pain.   4)  Please schedule a follow-up appointment in 1 year for CPE or sooner as needed.  Prescriptions: VIAGRA 50 MG TABS (SILDENAFIL CITRATE) Take one tablet by mouth 30 mins to 1 hour before activity; do not take more than one tablet in 24 hours  #15 per month x 2   Entered and Authorized by:   Tereso Newcomer PA-C   Signed by:   Tereso Newcomer PA-C on 10/18/2009   Method used:   Print then Give to Patient   RxID:   [id number]   Laboratory Results   Urine Tests    Routine Urinalysis   Glucose: negative   (Normal Range: Negative) Bilirubin: negative   (Normal Range: Negative) Ketone: negative   (Normal Range: Negative) Spec. Gravity: >=1.030   (Normal Range: 1.003-1.035) Blood: negative   (Normal Range: Negative) pH: 6.0   (Normal Range: 5.0-8.0) Protein: trace   (Normal Range: Negative) Urobilinogen: 0.2   (Normal Range: 0-1) Nitrite: negative   (Normal Range:  Negative) Leukocyte Esterace: negative   (Normal Range: Negative)

## 2010-02-14 NOTE — Miscellaneous (Signed)
  Clinical Lists Changes  Observations: Added new observation of CERVFUSIONHX: yes (07/01/2009 21:49) Added new observation of PAST SURG HX: Lipoma excised 10/2008 Cervical fusion (5.2011)   a. Dr. Phoebe Perch  (07/01/2009 21:49)       Past History:  Past Surgical History: Lipoma excised 10/2008 Cervical fusion (5.2011)   a. Dr. Phoebe Perch

## 2010-02-14 NOTE — Miscellaneous (Signed)
  Clinical Lists Changes  Observations: Added new observation of RBC M/UL: 5.04 M/uL (09/03/2007 21:41) Added new observation of BASOPHIL %: 1 % (09/03/2007 21:41) Added new observation of % EOS AUTO: 1 % (09/03/2007 21:41) Added new observation of MONOCYTE %: 6 % (09/03/2007 21:41) Added new observation of LYMPHS %: 32 % (09/03/2007 21:41) Added new observation of PMN %: 60 % (09/03/2007 21:41) Added new observation of PLATELETK/UL: 222 K/uL (09/03/2007 21:41) Added new observation of RDW: 17.8 % (09/03/2007 21:41) Added new observation of MCHC RBC: 31.9 g/dL (04/54/0981 19:14) Added new observation of MCV: 71.4 fL (09/03/2007 21:41) Added new observation of HCT: 36.0 % (09/03/2007 21:41) Added new observation of HGB: 11.5 g/dL (78/29/5621 30:86) Added new observation of WBC COUNT: 8.0 10*3/microliter (09/03/2007 21:41)
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
## 2010-02-14 NOTE — Letter (Signed)
Summary: REFERRAL/PHYSICAL THERAPY  REFERRAL/PHYSICAL THERAPY   Imported By: Arta Bruce 11/02/2008 12:26:40  _____________________________________________________________________  External Attachment:    Type:   Image     Comment:   External Document
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
## 2010-02-14 NOTE — Letter (Signed)
Summary: TEST ORDER FORM/MRI W/OUT/CONTRAST/APPT DATE & TIME  TEST ORDER FORM/MRI W/OUT/CONTRAST/APPT DATE & TIME   Imported By: Arta Bruce 10/27/2008 12:27:30  _____________________________________________________________________  External Attachment:    Type:   Image     Comment:   External Document

## 2010-02-14 NOTE — Letter (Signed)
Summary: MAILED REQUESTED RECORDS TO [facility]/ONGOING REQUEST  MAILED REQUESTED RECORDS TO [facility]/ONGOING REQUEST   Imported By: Arta Bruce 10/27/2008 11:35:21  _____________________________________________________________________  External Attachment:    Type:   Image     Comment:   External Document

## 2010-02-14 NOTE — Letter (Signed)
Summary: BEHAVIORAL GUIDELINE  BEHAVIORAL GUIDELINE   Imported By: Arta Bruce 09/24/2008 15:35:44  _____________________________________________________________________  External Attachment:    Type:   Image     Comment:   External Document

## 2010-02-14 NOTE — Letter (Signed)
Summary: [facility] Instructions  [facility]  [address] [place], [place] [address]   Phone: [phone]  Fax: [phone]       DAYDEN VIVERETTE    04-07-1955    MRN: [id number]       Procedure Day Dorna Bloom:  Farrell Ours  03/18/09     Arrival Time:   10:00AM     Procedure Time:  11:00AM     Location of Procedure:                    Juliann Pares _  [facility] (4th Floor)   PREPARATION FOR COLONOSCOPY WITH MIRALAX  Starting 5 days prior to your procedure 03/13/09 do not eat nuts, seeds, popcorn, corn, beans, peas,  salads, or any raw vegetables.  Do not take any fiber supplements (e.g. Metamucil, Citrucel, and Benefiber). ____________________________________________________________________________________________________   THE DAY BEFORE YOUR PROCEDURE         DATE: 03/17/09  DAY:  THURSDAY  1   Drink clear liquids the entire day-NO SOLID FOOD  2   Do not drink anything colored red or purple.  Avoid juices with pulp.  No orange juice.  3   Drink at least 64 oz. (8 glasses) of fluid/clear liquids during the day to prevent dehydration and help the prep work efficiently.  CLEAR LIQUIDS INCLUDE: Water Jello Ice Popsicles Tea (sugar ok, no milk/cream) Powdered fruit flavored drinks Coffee (sugar ok, no milk/cream) Gatorade Juice: apple, white grape, white cranberry  Lemonade Clear bullion, consomm, broth Carbonated beverages (any kind) Strained chicken noodle soup Hard Candy  4   Mix the entire bottle of Miralax with 64 oz. of Gatorade/Powerade in the morning and put in the refrigerator to chill.  5   At 3:00 pm take 2 Dulcolax/Bisacodyl tablets.  6   At 4:30 pm take one Reglan/Metoclopramide tablet.  7  Starting at 5:00 pm drink one 8 oz glass of the Miralax mixture every 15-20 minutes until you have finished drinking the entire 64 oz.  You should finish drinking prep around 7:30 or 8:00 pm.  8   If you are nauseated, you may take the 2nd Reglan/Metoclopramide  tablet at 6:30 pm.        9    At 8:00 pm take 2 more DULCOLAX/Bisacodyl tablets.     THE DAY OF YOUR PROCEDURE      DATE:  03/17/09  DAY: Farrell Ours  You may drink clear liquids until 9:00AM  (2 HOURS BEFORE PROCEDURE).   MEDICATION INSTRUCTIONS  Unless otherwise instructed, you should take regular prescription medications with a small sip of water as early as possible the morning of your procedure.          OTHER INSTRUCTIONS  You will need a responsible adult at least 56 years of age to accompany you and drive you home.   This person must remain in the waiting room during your procedure.  Wear loose fitting clothing that is easily removed.  Leave jewelry and other valuables at home.  However, you may wish to bring a book to read or an iPod/MP3 player to listen to music as you wait for your procedure to start.  Remove all body piercing jewelry and leave at home.  Total time from sign-in until discharge is approximately 2-3 hours.  You should go home directly after your procedure and rest.  You can resume normal activities the day after your procedure.  The day of your procedure you should not:  Drive   Make legal decisions   Operate machinery   Drink alcohol   Return to work  You will receive specific instructions about eating, activities and medications before you leave.   The above instructions have been reviewed and explained to me by   Wyona Almas RN  March 04, 2009 3:56 PM     I fully understand and can verbalize these instructions _____________________________ Date _______
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
## 2010-02-14 NOTE — Letter (Signed)
Summary: DR.MATTHEW WAKEFIELD  DR.MATTHEW WAKEFIELD   Imported By: Arta Bruce 11/01/2008 10:22:25  _____________________________________________________________________  External Attachment:    Type:   Image     Comment:   External Document

## 2010-02-14 NOTE — Miscellaneous (Signed)
  Clinical Lists Changes  Problems: Assessed OSTEOARTHRITIS, CERVICAL SPINE as comment only - saw Dr. Phoebe Perch 4.18.2011 C-spine surgery planned       Impression & Recommendations:  Problem # 1:  OSTEOARTHRITIS, CERVICAL SPINE (ICD-721.90) saw Dr. Phoebe Perch 4.18.2011 C-spine surgery planned  Complete Medication List: 1)  Naprosyn 500 Mg Tabs (Naproxen) .... Take 1 tablet by mouth two times a day as needed for pain 2)  Flomax 0.4 Mg Caps (Tamsulosin hcl) .... Take 1 tab by mouth at bedtime 3)  Flexeril 10 Mg Tabs (Cyclobenzaprine hcl) .... Take 1 tablet by mouth three times a day as needed for neck spasms

## 2010-02-14 NOTE — Letter (Signed)
Summary: [facility]  [facility]   Imported By: Arta Bruce 09/12/2009 15:59:46  _____________________________________________________________________  External Attachment:    Type:   Image     Comment:   External Document

## 2010-02-14 NOTE — Progress Notes (Signed)
Summary: No urine   Phone Note From Other Clinic   Summary of Call: [facility] called there was no urine sent for the Drug screen on this pt  Initial call taken by: Levon Hedger,  September 27, 2008 3:16 PM  Follow-up for Phone Call        pt is coming in tomorrow to give it to [place] Follow-up by: Arm[place] Shannon,  September 28, 2008 6:18 PM

## 2010-02-14 NOTE — Letter (Signed)
Summary: PATIENT INFORMATION SHEET  PATIENT INFORMATION SHEET   Imported By: Arta Bruce 09/24/2008 15:32:58  _____________________________________________________________________  External Attachment:    Type:   Image     Comment:   External Document

## 2010-02-14 NOTE — Miscellaneous (Signed)
  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

## 2010-02-14 NOTE — Progress Notes (Signed)
Summary: WILL NEED REFILLS  Phone Note Call from Patient Call back at Home Phone [phone]   Summary of Call: [name] CALLED AND SAYS THAT THE DR (DR [facility]) WHO PRESCRIBED HIM NAPROXEN 500MG  TOLD HIM THAT Xavier CAN PRESCRIBE HIM HIS MEDS FROM NOW ON.  HE USES [facility] ON [address]. Initial call taken by: Leodis Rains,  September 13, 2009 9:07 AM  Follow-up for Phone Call        Sent to S. Campbell.  Dutch Quint RN  September 13, 2009 10:33 AM  Pt. notified of available Rx.  Dutch Quint RN  September 13, 2009 11:59 AM     New/Updated Medications: NAPROSYN 500 MG TABS (NAPROXEN) Take 1 tablet by mouth two times a day as needed for pain (take with food) Prescriptions: NAPROSYN 500 MG TABS (NAPROXEN) Take 1 tablet by mouth two times a day as needed for pain (take with food)  #30 x 3   Entered and Authorized by:   Tereso Newcomer PA-C   Signed by:   Tereso Newcomer PA-C on 09/13/2009   Method used:   Electronically to        [facility] [phone]* (retail)       [address]       [place], [place]  [address]       Ph: [phone]       Fax: [phone]   RxID:   [id number]

## 2010-02-14 NOTE — Letter (Signed)
Summary: NEUROSUGERY NOTES  NEUROSUGERY NOTES   Imported By: Arta Bruce 05/16/2009 10:28:43  _____________________________________________________________________  External Attachment:    Type:   Image     Comment:   External Document

## 2010-02-14 NOTE — Letter (Signed)
Summary: [facility]  [facility]   Imported By: Arta Bruce 05/23/2009 15:24:00  _____________________________________________________________________  External Attachment:    Type:   Image     Comment:   External Document

## 2010-02-14 NOTE — Progress Notes (Signed)
  Phone Note Call from Patient Call back at Home Phone [phone]   Summary of Call: Pt called in to the [facility] pharmacy to get his medication but they had not receiving the fax request so to avoid any confusion pt wnats we fax the request to [facility] because they probably can help him sooner and the pharmacy  is closer to his house. Xavier Spittle PA-C Initial call taken by: Manon Hilding,  November 01, 2008 11:41 AM  Follow-up for Phone Call        Left message with lady for pt to call back.Marland KitchenMarland KitchenArmenia Shannon  November 04, 2008 10:24 AM   Additional Follow-up for Phone Call Additional follow up Details #1::        spoke with pt and let him know that he has to sign up for the MAP program for the flomax med....Marland KitchenMarland KitchenArmenia Shannon  November 04, 2008 10:47 AM

## 2010-02-14 NOTE — Letter (Signed)
Summary: [employer] FEE  SLIDING [employer] FEE   Imported By: Arta Bruce 09/24/2008 15:37:22  _____________________________________________________________________  External Attachment:    Type:   Image     Comment:   External Document

## 2010-04-04 LAB — URINALYSIS, ROUTINE W REFLEX MICROSCOPIC
Bilirubin Urine: NEGATIVE
Glucose, UA: NEGATIVE mg/dL
Hgb urine dipstick: NEGATIVE
Ketones, ur: NEGATIVE mg/dL
Nitrite: NEGATIVE
Protein, ur: NEGATIVE mg/dL
Specific Gravity, Urine: 1.025 (ref 1.005–1.030)
Urobilinogen, UA: 1 mg/dL (ref 0.0–1.0)
pH: 7 (ref 5.0–8.0)

## 2010-04-04 LAB — CBC
HCT: 38 % — ABNORMAL LOW (ref 39.0–52.0)
Hemoglobin: 12.6 g/dL — ABNORMAL LOW (ref 13.0–17.0)
MCHC: 33.1 g/dL (ref 30.0–36.0)
MCV: 67.2 fL — ABNORMAL LOW (ref 78.0–100.0)
Platelets: 202 10*3/uL (ref 150–400)
RBC: 5.66 MIL/uL (ref 4.22–5.81)
RDW: 16.1 % — ABNORMAL HIGH (ref 11.5–15.5)
WBC: 10.5 10*3/uL (ref 4.0–10.5)

## 2010-04-04 LAB — SURGICAL PCR SCREEN
MRSA, PCR: NEGATIVE
Staphylococcus aureus: NEGATIVE

## 2010-04-04 LAB — BASIC METABOLIC PANEL
BUN: 14 mg/dL (ref 6–23)
CO2: 30 mEq/L (ref 19–32)
Calcium: 9.5 mg/dL (ref 8.4–10.5)
Chloride: 107 mEq/L (ref 96–112)
Creatinine, Ser: 0.83 mg/dL (ref 0.4–1.5)
GFR calc Af Amer: >60 mL/min (ref >60)
GFR calc non Af Amer: >60 mL/min (ref >60)
Glucose, Bld: 98 mg/dL (ref 70–99)
Potassium: 4.4 mEq/L (ref 3.5–5.1)
Sodium: 142 mEq/L (ref 135–145)

## 2010-04-04 LAB — PROTIME-INR
INR: 1.12 (ref 0.00–1.49)
Prothrombin Time: 14.3 seconds (ref 11.6–15.2)

## 2010-04-04 LAB — APTT: aPTT: 31 seconds (ref 24–37)

## 2010-04-20 LAB — BASIC METABOLIC PANEL
BUN: 12 mg/dL (ref 6–23)
CO2: 25 mEq/L (ref 19–32)
Calcium: 9.2 mg/dL (ref 8.4–10.5)
Chloride: 110 mEq/L (ref 96–112)
Creatinine, Ser: 0.72 mg/dL (ref 0.4–1.5)
GFR calc Af Amer: >60 mL/min (ref >60)
GFR calc non Af Amer: >60 mL/min (ref >60)
Glucose, Bld: 107 mg/dL — ABNORMAL HIGH (ref 70–99)
Potassium: 3.7 mEq/L (ref 3.5–5.1)
Sodium: 139 mEq/L (ref 135–145)

## 2010-04-20 LAB — DIFFERENTIAL
Basophils Absolute: 0.1 10*3/uL (ref 0.0–0.1)
Basophils Relative: 1 % (ref 0–1)
Eosinophils Absolute: 0.2 10*3/uL (ref 0.0–0.7)
Eosinophils Relative: 2 % (ref 0–5)
Lymphocytes Relative: 33 % (ref 12–46)
Lymphs Abs: 3.4 10*3/uL (ref 0.7–4.0)
Monocytes Absolute: 0.6 10*3/uL (ref 0.1–1.0)
Monocytes Relative: 6 % (ref 3–12)
Neutro Abs: 6.1 10*3/uL (ref 1.7–7.7)
Neutrophils Relative %: 58 % (ref 43–77)

## 2010-04-20 LAB — CBC
HCT: 36 % — ABNORMAL LOW (ref 39.0–52.0)
Hemoglobin: 11.7 g/dL — ABNORMAL LOW (ref 13.0–17.0)
MCHC: 32.5 g/dL (ref 30.0–36.0)
MCV: 68 fL — ABNORMAL LOW (ref 78.0–100.0)
Platelets: 167 10*3/uL (ref 150–400)
RBC: 5.28 MIL/uL (ref 4.22–5.81)
RDW: 18.2 % — ABNORMAL HIGH (ref 11.5–15.5)
WBC: 10.4 10*3/uL (ref 4.0–10.5)
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
## 2010-09-22 ENCOUNTER — Other Ambulatory Visit: Payer: Self-pay | Admitting: Gastroenterology

## 2010-10-12 LAB — DIFFERENTIAL
Basophils Absolute: 0.1
Basophils Relative: 1
Eosinophils Absolute: 0.1
Eosinophils Relative: 0
Lymphocytes Relative: 10 — ABNORMAL LOW
Lymphs Abs: 1.4
Monocytes Absolute: 0.3
Monocytes Relative: 2 — ABNORMAL LOW
Neutro Abs: 12 — ABNORMAL HIGH
Neutrophils Relative %: 87 — ABNORMAL HIGH

## 2010-10-12 LAB — POCT I-STAT, CHEM 8
BUN: 13
Calcium, Ion: 1.09 — ABNORMAL LOW
Chloride: 109
Creatinine, Ser: 0.9
Glucose, Bld: 105 — ABNORMAL HIGH
HCT: 38 — ABNORMAL LOW
Hemoglobin: 12.9 — ABNORMAL LOW
Potassium: 3.6
Sodium: 144
TCO2: 21

## 2010-10-12 LAB — CBC
HCT: 36.9 — ABNORMAL LOW
Hemoglobin: 12 — ABNORMAL LOW
MCHC: 32.6
MCV: 70.3 — ABNORMAL LOW
Platelets: 156
RBC: 5.25
RDW: 16.3 — ABNORMAL HIGH
WBC: 13.8 — ABNORMAL HIGH

## 2010-10-12 LAB — APTT: aPTT: 26

## 2010-10-12 LAB — PROTIME-INR
INR: 1.1
Prothrombin Time: 14

## 2010-10-12 LAB — ETHANOL: Alcohol, Ethyl (B): 166 — ABNORMAL HIGH

## 2010-10-13 LAB — CBC
HCT: 34.4 — ABNORMAL LOW
Hemoglobin: 11.2 — ABNORMAL LOW
MCHC: 32.4
MCV: 70.9 — ABNORMAL LOW
Platelets: 274
RBC: 4.86
RDW: 16.6 — ABNORMAL HIGH
WBC: 8.1

## 2010-10-13 LAB — DIFFERENTIAL
Band Neutrophils: 0
Basophils Absolute: 0
Basophils Relative: 0
Blasts: 0
Eosinophils Absolute: 0
Eosinophils Relative: 0
Lymphocytes Relative: 50 — ABNORMAL HIGH
Lymphs Abs: 4.1 — ABNORMAL HIGH
Metamyelocytes Relative: 0
Monocytes Absolute: 0.3
Monocytes Relative: 4
Myelocytes: 0
Neutro Abs: 3.7
Neutrophils Relative %: 46
Promyelocytes Absolute: 0
nRBC: 0

## 2010-10-13 LAB — CULTURE, BLOOD (ROUTINE X 2): Culture: NO GROWTH

## 2010-10-25 ENCOUNTER — Ambulatory Visit (HOSPITAL_COMMUNITY)
Admission: RE | Admit: 2010-10-25 | Discharge: 2010-10-25 | Disposition: A | Discharge status: Home / Self Care | Payer: Medicaid Other | Source: Ambulatory Visit | Attending: Internal Medicine | Admitting: Internal Medicine

## 2010-10-25 ENCOUNTER — Other Ambulatory Visit (HOSPITAL_COMMUNITY): Payer: Self-pay | Admitting: Internal Medicine

## 2010-10-25 DIAGNOSIS — R52 Pain, unspecified: Secondary | ICD-10-CM

## 2010-10-25 DIAGNOSIS — R079 Chest pain, unspecified: Secondary | ICD-10-CM | POA: Insufficient documentation

## 2010-10-25 DIAGNOSIS — R0602 Shortness of breath: Secondary | ICD-10-CM | POA: Insufficient documentation

## 2011-03-07 ENCOUNTER — Other Ambulatory Visit (HOSPITAL_COMMUNITY): Payer: Self-pay | Admitting: Otolaryngology

## 2011-03-07 DIAGNOSIS — R12 Heartburn: Secondary | ICD-10-CM

## 2011-03-13 ENCOUNTER — Ambulatory Visit (HOSPITAL_COMMUNITY)
Admission: RE | Admit: 2011-03-13 | Discharge: 2011-03-13 | Disposition: A | Discharge status: Home / Self Care | Payer: Medicaid Other | Source: Ambulatory Visit | Attending: Otolaryngology | Admitting: Otolaryngology

## 2011-03-13 ENCOUNTER — Other Ambulatory Visit: Payer: Self-pay

## 2011-03-13 ENCOUNTER — Emergency Department (HOSPITAL_COMMUNITY)
Admission: EM | Admit: 2011-03-13 | Discharge: 2011-03-13 | Disposition: A | Discharge status: Home / Self Care | Payer: Medicaid Other | Attending: Emergency Medicine | Admitting: Emergency Medicine

## 2011-03-13 ENCOUNTER — Emergency Department (HOSPITAL_COMMUNITY): Payer: Medicaid Other

## 2011-03-13 ENCOUNTER — Encounter (HOSPITAL_COMMUNITY): Payer: Self-pay

## 2011-03-13 DIAGNOSIS — F17200 Nicotine dependence, unspecified, uncomplicated: Secondary | ICD-10-CM | POA: Insufficient documentation

## 2011-03-13 DIAGNOSIS — R209 Unspecified disturbances of skin sensation: Secondary | ICD-10-CM | POA: Insufficient documentation

## 2011-03-13 DIAGNOSIS — R200 Anesthesia of skin: Secondary | ICD-10-CM

## 2011-03-13 DIAGNOSIS — R1310 Dysphagia, unspecified: Secondary | ICD-10-CM | POA: Insufficient documentation

## 2011-03-13 DIAGNOSIS — R12 Heartburn: Secondary | ICD-10-CM

## 2011-03-13 DIAGNOSIS — Z79899 Other long term (current) drug therapy: Secondary | ICD-10-CM | POA: Insufficient documentation

## 2011-03-13 DIAGNOSIS — R0789 Other chest pain: Secondary | ICD-10-CM | POA: Insufficient documentation

## 2011-03-13 DIAGNOSIS — K449 Diaphragmatic hernia without obstruction or gangrene: Secondary | ICD-10-CM | POA: Insufficient documentation

## 2011-03-13 DIAGNOSIS — K219 Gastro-esophageal reflux disease without esophagitis: Secondary | ICD-10-CM | POA: Insufficient documentation

## 2011-03-13 LAB — COMPREHENSIVE METABOLIC PANEL
ALT: 28 U/L (ref 0–53)
AST: 17 U/L (ref 0–37)
Albumin: 4 g/dL (ref 3.5–5.2)
Alkaline Phosphatase: 68 U/L (ref 39–117)
BUN: 15 mg/dL (ref 6–23)
CO2: 28 mEq/L (ref 19–32)
Calcium: 10 mg/dL (ref 8.4–10.5)
Chloride: 102 mEq/L (ref 96–112)
Creatinine, Ser: 0.69 mg/dL (ref 0.50–1.35)
GFR calc Af Amer: >90 mL/min (ref >90)
GFR calc non Af Amer: >90 mL/min (ref >90)
Glucose, Bld: 98 mg/dL (ref 70–99)
Potassium: 3.9 mEq/L (ref 3.5–5.1)
Sodium: 139 mEq/L (ref 135–145)
Total Bilirubin: 0.4 mg/dL (ref 0.3–1.2)
Total Protein: 7.5 g/dL (ref 6.0–8.3)

## 2011-03-13 LAB — DIFFERENTIAL
Basophils Absolute: 0.3 10*3/uL — ABNORMAL HIGH (ref 0.0–0.1)
Basophils Relative: 2 % — ABNORMAL HIGH (ref 0–1)
Eosinophils Absolute: 0 10*3/uL (ref 0.0–0.7)
Eosinophils Relative: 0 % (ref 0–5)
Lymphocytes Relative: 25 % (ref 12–46)
Lymphs Abs: 4.2 10*3/uL — ABNORMAL HIGH (ref 0.7–4.0)
Monocytes Absolute: 1 10*3/uL (ref 0.1–1.0)
Monocytes Relative: 6 % (ref 3–12)
Neutro Abs: 11.3 10*3/uL — ABNORMAL HIGH (ref 1.7–7.7)
Neutrophils Relative %: 67 % (ref 43–77)

## 2011-03-13 LAB — CBC
HCT: 32.1 % — ABNORMAL LOW (ref 39.0–52.0)
Hemoglobin: 11.6 g/dL — ABNORMAL LOW (ref 13.0–17.0)
MCH: 21.8 pg — ABNORMAL LOW (ref 26.0–34.0)
MCHC: 36.1 g/dL — ABNORMAL HIGH (ref 30.0–36.0)
MCV: 60.2 fL — ABNORMAL LOW (ref 78.0–100.0)
Platelets: 254 10*3/uL (ref 150–400)
RBC: 5.33 MIL/uL (ref 4.22–5.81)
RDW: 16.9 % — ABNORMAL HIGH (ref 11.5–15.5)
WBC: 16.8 10*3/uL — ABNORMAL HIGH (ref 4.0–10.5)

## 2011-03-13 LAB — POCT I-STAT TROPONIN I: Troponin i, poc: 0 ng/mL (ref 0.00–0.08)

## 2011-03-13 LAB — PROTIME-INR
INR: 1.11 (ref 0.00–1.49)
Prothrombin Time: 14.5 seconds (ref 11.6–15.2)

## 2011-03-13 LAB — APTT: aPTT: 28 seconds (ref 24–37)

## 2011-03-13 NOTE — Discharge Instructions (Signed)
Your tests today were good. Your CT scan does not show a stroke. Your trouble swallowing may be from your prior neck surgery. You need to follow-up with Dr Wynelle Link, she may want you to have an outpatient MRI to further your evaluation. Return to the ED if you get chest pain.

## 2011-03-13 NOTE — ED Notes (Signed)
Pt reports symptoms for 1.5 months, numbness in right face and right hand, comes and goes, lasts less than 5 mins, never seen MD for this

## 2011-03-13 NOTE — ED Provider Notes (Cosign Needed)
History     CSN: [id number]  Arrival date & time 03/13/11  1229   First MD Initiated Contact with Patient 03/13/11 1334      Chief Complaint  Patient presents with  . Numbness    (Consider location/radiation/quality/duration/timing/severity/associated sxs/prior treatment) HPI  Patient relates for the past 6 months he gets a chest discomfort then he feels like his face is numb bilaterally. He states this happened 3 times in the past 6 months. First time was 6 months ago while he was sitting in a chair. The second time was about 3 months ago when sitting on a bus. And he does not crawl and last one was. He states he is seeing his physician Dr. Wynelle Link are same and she referred him to ENT and he saw Dr. Emeline Darling. He relates today he was sent for swallow test and he was told "he have numbness in your face and he had a stroke in the ER". He states for the past 2 months when he is sleeping he chokes when he swallows and feels like he has "hair balls in my throat". He's had a headache off and on but is right-sided for the past couple weeks. He states  His fingertips feel numb in both hands. He states the chest pain when he gets it stabbing in quality. He states is normally right-handed. He is having no difficulty walking or using his arms such as to dress himself for daily living. He relates he was seen by the ENT doctor for 4-5 days ago was placed on Augmentin and Medrol for some type of infection that he does not know what it was.  PCP Dr. Wynelle Link ENT Dr Emeline Darling  Past Medical History  Diagnosis Date  . Arthritis     Past Surgical History  Procedure Date  . Neck surgery     History reviewed. No pertinent family history.  History  Substance Use Topics  . Smoking status: Current Everyday Smoker  . Smokeless tobacco: Not on file  . Alcohol Use: No   disability for his neck problems    Review of Systems  All other systems reviewed and are negative.    Allergies  Review of patient's allergies  indicates no known allergies.  Home Medications   Current Outpatient Rx  Name Route Sig Dispense Refill  . AMITRIPTYLINE HCL 100 MG PO TABS Oral Take 100 mg by mouth at bedtime.    . AMOXICILLIN-POT CLAVULANATE 500-125 MG PO TABS Oral Take 1 tablet by mouth 2 (two) times daily. Started 03/08/11 for 10 days    . BRIMONIDINE TARTRATE-TIMOLOL 0.2-0.5 % OP SOLN Left Eye Place 1 drop into the left eye every 12 (twelve) hours.    Marland Kitchen HYDROCODONE-ACETAMINOPHEN 5-325 MG PO TABS Oral Take 1 tablet by mouth every 6 (six) hours as needed. For pain    . METHYLPREDNISOLONE 4 MG PO KIT Oral Take 4 mg by mouth as directed. follow package directions    . OMEPRAZOLE 20 MG PO CPDR Oral Take 20 mg by mouth daily as needed. For heartburn    . TRAVOPROST (BAK FREE) 0.004 % OP SOLN Both Eyes Place 1 drop into both eyes at bedtime.      BP 123/81  Pulse 65  Temp(Src) 98 F (36.7 C) (Oral)  Resp 20  SpO2 100%  Vital signs normal    Physical Exam  Nursing note and vitals reviewed. Constitutional: He is oriented to person, place, and time. He appears well-developed and well-nourished.  Non-toxic appearance.  He does not appear ill. No distress.  HENT:  Head: Normocephalic and atraumatic.  Right Ear: External ear normal.  Left Ear: External ear normal.  Nose: Nose normal. No mucosal edema or rhinorrhea.  Mouth/Throat: Oropharynx is clear and moist and mucous membranes are normal. No dental abscesses or uvula swelling.  Eyes: Conjunctivae and EOM are normal. Pupils are equal, round, and reactive to light.  Neck: Normal range of motion and full passive range of motion without pain. Neck supple.  Cardiovascular: Normal rate, regular rhythm and normal heart sounds.  Exam reveals no gallop and no friction rub.   No murmur heard. Pulmonary/Chest: Effort normal and breath sounds normal. No respiratory distress. He has no wheezes. He has no rhonchi. He has no rales. He exhibits no tenderness and no crepitus.    Abdominal: Soft. Normal appearance and bowel sounds are normal. He exhibits no distension. There is no tenderness. There is no rebound and no guarding.  Musculoskeletal: Normal range of motion. He exhibits no edema and no tenderness.       Moves all extremities well.   Neurological: He is alert and oriented to person, place, and time. He has normal strength. No cranial nerve deficit.  Skin: Skin is warm, dry and intact. No rash noted. No erythema. No pallor.  Psychiatric: He has a normal mood and affect. His speech is normal and behavior is normal. His mood appears not anxious.    ED Course  Procedures (including critical care time)  After the result of the barium swallow test I spoke to Dr Chestine Spore who said the speech therapist told the patient to come to the ED. I cannot find the result of that test  The speech therapist note relates some mild weakness that may be related to his prior surgery and she brought the patient to the ED for evaluation.   Pt states his last episode of chest pain was "a while back"  Results for orders placed during the hospital encounter of 03/13/11  CBC      Component Value Range   WBC 16.8 (*) 4.0 - 10.5 (K/uL)   RBC 5.33  4.22 - 5.81 (MIL/uL)   Hemoglobin 11.6 (*) 13.0 - 17.0 (g/dL)   HCT 02.7 (*) 25.3 - 52.0 (%)   MCV 60.2 (*) 78.0 - 100.0 (fL)   MCH 21.8 (*) 26.0 - 34.0 (pg)   MCHC 36.1 (*) 30.0 - 36.0 (g/dL)   RDW 66.4 (*) 40.3 - 15.5 (%)   Platelets 254  150 - 400 (K/uL)  DIFFERENTIAL      Component Value Range   Neutrophils Relative 67  43 - 77 (%)   Lymphocytes Relative 25  12 - 46 (%)   Monocytes Relative 6  3 - 12 (%)   Eosinophils Relative 0  0 - 5 (%)   Basophils Relative 2 (*) 0 - 1 (%)   Neutro Abs 11.3 (*) 1.7 - 7.7 (K/uL)   Lymphs Abs 4.2 (*) 0.7 - 4.0 (K/uL)   Monocytes Absolute 1.0  0.1 - 1.0 (K/uL)   Eosinophils Absolute 0.0  0.0 - 0.7 (K/uL)   Basophils Absolute 0.3 (*) 0.0 - 0.1 (K/uL)   RBC Morphology TARGET CELLS     Smear  Review LARGE PLATELETS PRESENT    COMPREHENSIVE METABOLIC PANEL      Component Value Range   Sodium 139  135 - 145 (mEq/L)   Potassium 3.9  3.5 - 5.1 (mEq/L)   Chloride 102  96 - 112 (  mEq/L)   CO2 28  19 - 32 (mEq/L)   Glucose, Bld 98  70 - 99 (mg/dL)   BUN 15  6 - 23 (mg/dL)   Creatinine, Ser 1.61  0.50 - 1.35 (mg/dL)   Calcium 09.6  8.4 - 10.5 (mg/dL)   Total Protein 7.5  6.0 - 8.3 (g/dL)   Albumin 4.0  3.5 - 5.2 (g/dL)   AST 17  0 - 37 (U/L)   ALT 28  0 - 53 (U/L)   Alkaline Phosphatase 68  39 - 117 (U/L)   Total Bilirubin 0.4  0.3 - 1.2 (mg/dL)   GFR calc non Af Amer >90  >90 (mL/min)   GFR calc Af Amer >90  >90 (mL/min)  APTT      Component Value Range   aPTT 28  24 - 37 (seconds)  PROTIME-INR      Component Value Range   Prothrombin Time 14.5  11.6 - 15.2 (seconds)   INR 1.11  0.00 - 1.49   POCT I-STAT TROPONIN I      Component Value Range   Troponin i, poc 0.00  0.00 - 0.08 (ng/mL)   Comment 3            Laboratory interpretation all normal except mild anemia    Ct Head Wo Contrast  03/13/2011  *RADIOLOGY REPORT*  Clinical Data: Intermittent numbness in the right face and hand for one and one half months.  CT HEAD WITHOUT CONTRAST  Technique:  Contiguous axial images were obtained from the base of the skull through the vertex without contrast.  Comparison: Head CT scans 07/27/2007 and brain MRI 08/05/2007.  Findings: The brain appears normal above of acute infarction, hemorrhage, mass lesion, mass effect, midline shift or abnormal extra-axial fluid collection.  Old fractures about the left orbit with hardware in place noted.  IMPRESSION: No acute finding.  Original Report Authenticated By: Bernadene Bell. D'ALESSIO, M.D.   Dg Esophagus  03/13/2011  *RADIOLOGY REPORT*  Clinical Data: Dysphagia  ESOPHOGRAM/BARIUM SWALLOW  Technique:  Combined double contrast and single contrast examination performed using effervescent crystals, thick barium liquid, and thin barium liquid.   Fluoroscopy time:  0.5 minutes.  Comparison:  None.  Findings:  Mild decrease in esophageal motility.  No stricture mass or diverticulum is present.  Barium tablet passed readily into the stomach without delay.  Small hiatal hernia with mild gastroesophageal reflux.  IMPRESSION: Mild decreased esophageal peristalsis.  Small hiatal hernia with mild gastroesophageal reflux.  Original Report Authenticated By: Camelia Phenes, M.D.   Dg Swallowing Func-no Report  03/13/2011  CLINICAL DATA: dysphagia   FLUOROSCOPY FOR SWALLOWING FUNCTION STUDY:  Fluoroscopy was provided for swallowing function study, which was  administered by a speech pathologist.  Final results and recommendations  from this study are contained within the speech pathology report.       Date: 03/13/2011  Rate: 57  Rhythm: sinus bradycardia  QRS Axis: normal  Intervals: normal  ST/T Wave abnormalities: normal  Conduction Disutrbances:none  Narrative Interpretation:   Old EKG Reviewed: unchanged 10/26/2008     1. Numbness     Plan discharge Devoria Albe, MD, FACEP   MDM          Ward Givens, MD 03/13/11 [phone]

## 2011-03-13 NOTE — Procedures (Signed)
Modified Barium Swallow Procedure Note Patient Details  Name: Xavier Campbell MRN: [id number] Date of Birth: [birth year]  Today's Date: 03/13/2011  Past Medical History:  Past Medical History  Diagnosis Date  . Arthritis    Past Surgical History:  Past Surgical History  Procedure Date  . Neck surgery    HPI:  56 yo male with a h/o reflux reports to [facility] c/o a globus sensation at night when he lays down to go to sleep. Pt says that this problem occassionally happens during the day, but not at meal times. Pt also reports that he has been experiencing numbness, weakness, and tingling on and off on the right side of his face and in his right hand. (SLP notes mild dysarthria which patient denies)   Recommendation/Prognosis  Clinical Impression Dysphagia Diagnosis: Mild oral phase dysphagia;Mild pharyngeal phase dysphagia;Mild cervical esophageal phase dysphagia Clinical impression: Pt presents with a mild oropharyngeal and cervical esophageal dysphagia characterized by a delayed oral transit time due to oral weakness and mild pharyngeal residue, greater at the CP segment than the valleculae, secondary to mild laryngeal/pharyngeal weakness and reduced CP relaxation (? due to cervical hardware s/p ACDF at C4-7). Despite above, pt safely protected his airway across all trials with no aspiration or penetration, spontaneously utilizing dry swallows to clear residue. Recommend a regular diet with thin liquids. Also recommend a neuro workup for pt reports of intermittent numbness, tingling, and weakness in the right face and hand throughout the past month. Referring MD was notified via phone of neuro symptoms and pt was escorted to the ED by this SLP. F/u SLP services TBD pending neuro workup.  Swallow Evaluation Recommendations Recommended Consults: Other (Comment) (recommend neuro workup given weakness/numbness/tingling) Solid Consistency: Regular Liquid Consistency: Thin Liquid Administration  via: Cup;Straw Medication Administration: Whole meds with liquid Supervision: Patient able to self feed;Intermittent supervision to cue for compensatory strategies Compensations: Slow rate;Small sips/bites;Multiple dry swallows after each bite/sip Postural Changes and/or Swallow Maneuvers: Seated upright 90 degrees;Upright 30-60 min after meal Oral Care Recommendations: Oral care BID Follow up Recommendations: Other (comment);None (pending possible neuro w/u, SLP f/u may be indicated) Prognosis Prognosis for Safe Diet Advancement: Good Individuals Consulted Consulted and Agree with Results and Recommendations: Patient   [name] 03/13/2011, 2:01 PM   [name], SLP Student

## 2011-03-26 ENCOUNTER — Encounter: Payer: Self-pay | Admitting: Gastroenterology

## 2011-04-09 ENCOUNTER — Encounter: Payer: Self-pay | Admitting: Gastroenterology

## 2011-04-09 ENCOUNTER — Ambulatory Visit (INDEPENDENT_AMBULATORY_CARE_PROVIDER_SITE_OTHER): Payer: Medicaid Other | Admitting: Gastroenterology

## 2011-04-09 VITALS — BP 128/74 | HR 88 | Ht 72.0 in | Wt 146.0 lb

## 2011-04-09 DIAGNOSIS — R1310 Dysphagia, unspecified: Secondary | ICD-10-CM | POA: Insufficient documentation

## 2011-04-09 DIAGNOSIS — K219 Gastro-esophageal reflux disease without esophagitis: Secondary | ICD-10-CM | POA: Insufficient documentation

## 2011-04-09 MED ORDER — OMEPRAZOLE 20 MG PO CPDR: 20.0000 mg | DELAYED_RELEASE_CAPSULE | Freq: Every day | ORAL | Status: DC

## 2011-04-09 NOTE — Assessment & Plan Note (Signed)
Plan to restart omeprazole

## 2011-04-09 NOTE — Patient Instructions (Signed)
Diet for GERD or PUD  Nutrition therapy can help ease the discomfort of gastroesophageal reflux disease (GERD) and peptic ulcer disease (PUD).   HOME CARE INSTRUCTIONS    Eat your meals slowly, in a relaxed setting.   Eat 5 to 6 small meals per day.   If a food causes distress, stop eating it for a period of time.  FOODS TO AVOID   Coffee, regular or decaffeinated.   Cola beverages, regular or low calorie.   Tea, regular or decaffeinated.   Pepper.   Cocoa.   High fat foods, including meats.   Butter, margarine, hydrogenated oil (trans fats).   Peppermint or spearmint (if you have GERD).   Fruits and vegetables if not tolerated.   Alcohol.   Nicotine (smoking or chewing). This is one of the most potent stimulants to acid production in the gastrointestinal tract.   Any food that seems to aggravate your condition.  If you have questions regarding your diet, ask your caregiver or a registered dietitian.  TIPS   Lying flat may make symptoms worse. Keep the head of your bed raised 6 to 9 inches (15 to 23 cm) by using a foam wedge or blocks under the legs of the bed.   Do not lay down until 3 hours after eating a meal.   Daily physical activity may help reduce symptoms.  MAKE SURE YOU:    Understand these instructions.   Will watch your condition.   Will get help right away if you are not doing well or get worse.  Document Released: 01/01/2005 Document Revised: 12/21/2010 Document Reviewed: 11/17/2010  Exit[employer] Patient Information 2012 [employer]    Gastroesophageal Reflux Disease, Adult  Gastroesophageal reflux disease (GERD) happens when acid from your stomach flows up into the esophagus. When acid comes in contact with the esophagus, the acid causes soreness (inflammation) in the esophagus. Over time, GERD may create small holes (ulcers) in the lining of the esophagus.  CAUSES    Increased body weight. This puts pressure on the stomach, making acid rise from the stomach into the  esophagus.   Smoking. This increases acid production in the stomach.   Drinking alcohol. This causes decreased pressure in the lower esophageal sphincter (valve or ring of muscle between the esophagus and stomach), allowing acid from the stomach into the esophagus.   Late evening meals and a full stomach. This increases pressure and acid production in the stomach.   A malformed lower esophageal sphincter.  Sometimes, no cause is found.  SYMPTOMS    Burning pain in the lower part of the mid-chest behind the breastbone and in the mid-stomach area. This may occur twice a week or more often.   Trouble swallowing.   Sore throat.   Dry cough.   Asthma-like symptoms including chest tightness, shortness of breath, or wheezing.  DIAGNOSIS   Your caregiver may be able to diagnose GERD based on your symptoms. In some cases, X-rays and other tests may be done to check for complications or to check the condition of your stomach and esophagus.  TREATMENT   Your caregiver may recommend over-the-counter or prescription medicines to help decrease acid production. Ask your caregiver before starting or adding any new medicines.   HOME CARE INSTRUCTIONS    Change the factors that you can control. Ask your caregiver for guidance concerning weight loss, quitting smoking, and alcohol consumption.   Avoid foods and drinks that make your symptoms worse, such as:   Caffeine or   alcoholic drinks.   Chocolate.   Peppermint or mint flavorings.   Garlic and onions.   Spicy foods.   Citrus fruits, such as oranges, lemons, or limes.   Tomato-based foods such as sauce, chili, salsa, and pizza.   Fried and fatty foods.   Avoid lying down for the 3 hours prior to your bedtime or prior to taking a nap.   Eat small, frequent meals instead of large meals.   Wear loose-fitting clothing. Do not wear anything tight around your waist that causes pressure on your stomach.   Raise the head of your bed 6 to 8 inches with wood blocks to  help you sleep. Extra pillows will not help.   Only take over-the-counter or prescription medicines for pain, discomfort, or fever as directed by your caregiver.   Do not take aspirin, ibuprofen, or other nonsteroidal anti-inflammatory drugs (NSAIDs).  SEEK IMMEDIATE MEDICAL CARE IF:    You have pain in your arms, neck, jaw, teeth, or back.   Your pain increases or changes in intensity or duration.   You develop nausea, vomiting, or sweating (diaphoresis).   You develop shortness of breath, or you faint.   Your vomit is green, yellow, black, or looks like coffee grounds or blood.   Your stool is red, bloody, or black.  These symptoms could be signs of other problems, such as heart disease, gastric bleeding, or esophageal bleeding.  MAKE SURE YOU:    Understand these instructions.   Will watch your condition.   Will get help right away if you are not doing well or get worse.  Document Released: 10/11/2004 Document Revised: 12/21/2010 Document Reviewed: 07/21/2010  [employer] Patient Information 2012 [employer].

## 2011-04-09 NOTE — Progress Notes (Signed)
History of Present Illness: Xavier Campbell is a 56 year old Afro-American male referred at the request of Dr. Roseanne Reno for evaluation of dysphagia. She complains of solids. He's had regurgitation of gastric contents as well with occasional coughing. He underwent flexor laryngoscopy that was unremarkable. A modified barium swallow apparently demonstrated mild oropharyngeal dysphasia secondary to weakness in the laryngeal and pharyngeal areas related to his prior cervical spine surgery.  Esophagram demonstrates slight decreased peristalsis.  He had pyrosis that was helped with omeprazole. Upon stopping omeprazole he has pyrosis again. Colonoscopy 3 years ago was normal.    Past Medical History  Diagnosis Date  . Arthritis   . Hypertrophy of prostate without urinary obstruction and other lower urinary tract symptoms (LUTS)   . Osteoarthritis   . Lipoma   . Alcoholic     former treatment in 2009  . Drug abuse     hx of crack abuse  . GERD (gastroesophageal reflux disease)    Past Surgical History  Procedure Date  . Neck surgery   . Appendectomy   . Cataract extraction   . Cervical fusion   . Hernia repair    family history includes Diabetes in his mother and Heart disease in his father and mother.  There is no history of Colon cancer. Current Outpatient Prescriptions  Medication Sig Dispense Refill  . amitriptyline (ELAVIL) 100 MG tablet Take 100 mg by mouth at bedtime.      . brimonidine-timolol (COMBIGAN) 0.2-0.5 % ophthalmic solution Place 1 drop into the left eye every 12 (twelve) hours.      Marland Kitchen HYDROcodone-acetaminophen (NORCO) 5-325 MG per tablet Take 1 tablet by mouth every 6 (six) hours as needed. For pain      . omeprazole (PRILOSEC) 20 MG capsule Take 20 mg by mouth daily as needed. For heartburn      . Travoprost, BAK Free, (TRAVATAN) 0.004 % SOLN ophthalmic solution Place 1 drop into both eyes at bedtime.       Allergies as of 04/09/2011  . (No Known Allergies)    reports that  he has been smoking.  He has never used smokeless tobacco. He reports that he does not drink alcohol or use illicit drugs.     Review of Systems:  he complains of burning discomfort extending from his posterior neck to his shoulders. He's had muscle cramps, shortness of breath and sleeping problems. Pertinent positive and negative review of systems were noted in the above HPI section. All other review of systems were otherwise negative.  Vital signs were reviewed in today's medical record Physical Exam: General: Well developed , well nourished, no acute distress Head: Normocephalic and atraumatic Eyes:  sclerae anicteric, EOMI Ears: Normal auditory acuity Mouth: No deformity or lesions Neck: Supple, no masses or thyromegaly Lungs: Clear throughout to auscultation Heart: Regular rate and rhythm; no murmurs, rubs or bruits Abdomen: Soft, non tender and non distended. No masses, hepatosplenomegaly or hernias noted. Normal Bowel sounds Rectal:deferred Musculoskeletal: Symmetrical with no gross deformities  Skin: No lesions on visible extremities Pulses:  Normal pulses noted Extremities: No clubbing, cyanosis, edema or deformities noted Neurological: Alert oriented x 4, grossly nonfocal Cervical Nodes:  No significant cervical adenopathy Inguinal Nodes: No significant inguinal adenopathy Psychological:  Alert and cooperative. Normal mood and affect

## 2011-04-09 NOTE — Assessment & Plan Note (Signed)
Although there is no frank stricture by barium swallow he may have some mild narrowing causing dysphagia. There is likely a component of oropharyngeal dysphagia as well.  Recommendations #1 upper endoscopy with dilatation as indicated

## 2011-04-10 ENCOUNTER — Encounter: Payer: Self-pay | Admitting: Gastroenterology

## 2011-04-10 ENCOUNTER — Ambulatory Visit (AMBULATORY_SURGERY_CENTER): Payer: Medicaid Other | Admitting: Gastroenterology

## 2011-04-10 VITALS — BP 134/86 | HR 58 | Temp 97.0°F | Resp 18 | Ht 72.0 in | Wt 146.0 lb

## 2011-04-10 DIAGNOSIS — D571 Sickle-cell disease without crisis: Secondary | ICD-10-CM | POA: Insufficient documentation

## 2011-04-10 DIAGNOSIS — K222 Esophageal obstruction: Secondary | ICD-10-CM

## 2011-04-10 DIAGNOSIS — K219 Gastro-esophageal reflux disease without esophagitis: Secondary | ICD-10-CM

## 2011-04-10 DIAGNOSIS — R1310 Dysphagia, unspecified: Secondary | ICD-10-CM

## 2011-04-10 MED ORDER — SODIUM CHLORIDE 0.9 % IV SOLN: 500.0000 mL | INTRAVENOUS | Status: DC

## 2011-04-10 NOTE — Progress Notes (Signed)
Patient did not experience any of the following events: a burn prior to discharge; a fall within the facility; wrong site/side/patient/procedure/implant event; or a hospital transfer or hospital admission upon discharge from the facility. (G8907) Patient did not have preoperative order for IV antibiotic SSI prophylaxis. (G8918)  

## 2011-04-10 NOTE — Patient Instructions (Addendum)
YOU HAD AN ENDOSCOPIC PROCEDURE TODAY AT THE [facility]: Refer to the procedure report that was given to you for any specific questions about what was found during the examination.  If the procedure report does not answer your questions, please call your gastroenterologist to clarify.  If you requested that your care partner not be given the details of your procedure findings, then the procedure report has been included in a sealed envelope for you to review at your convenience later.  YOU SHOULD EXPECT: Some feelings of bloating in the abdomen. Passage of more gas than usual.  Walking can help get rid of the air that was put into your GI tract during the procedure and reduce the bloating. If you had a lower endoscopy (such as a colonoscopy or flexible sigmoidoscopy) you may notice spotting of blood in your stool or on the toilet paper. If you underwent a bowel prep for your procedure, then you may not have a normal bowel movement for a few days.  DIET: Your first meal following the procedure should be a light meal and then it is ok to progress to your normal diet.  A half-sandwich or bowl of soup is an example of a good first meal.  Heavy or fried foods are harder to digest and may make you feel nauseous or bloated.  Likewise meals heavy in dairy and vegetables can cause extra gas to form and this can also increase the bloating.  Drink plenty of fluids but you should avoid alcoholic beverages for 24 hours. PLEASE FOLLOW THE ESOPHAGEAL DILATION DIET AS FOLLOWS: -NOTHING BY  MOUTH UNTIL 4:45PM -CLEAR LIQUIDS FOR 1 HOURS 4:45PM UNTIL 5:45PM -SOFT FOODS FOR THE REST OF TODAY 5:45 UNTIL TOMORROW  ACTIVITY: Your care partner should take you home directly after the procedure.  You should plan to take it easy, moving slowly for the rest of the day.  You can resume normal activity the day after the procedure however you should NOT DRIVE or use heavy machinery for 24 hours (because of the sedation  medicines used during the test).    SYMPTOMS TO REPORT IMMEDIATELY: A gastroenterologist can be reached at any hour.  During normal business hours, 8:30 AM to 5:00 PM Monday through Friday, call [phone].  After hours and on weekends, please call the GI answering service at [phone] who will take a message and have the physician on call contact you.   Following upper endoscopy (EGD)  Vomiting of blood or coffee ground material  New chest pain or pain under the shoulder blades  Painful or persistently difficult swallowing  New shortness of breath  Fever of 100F or higher  Black, tarry-looking stools  FOLLOW UP: If any biopsies were taken you will be contacted by phone or by letter within the next 1-3 weeks.  Call your gastroenterologist if you have not heard about the biopsies in 3 weeks.  Our staff will call the home number listed on your records the next business day following your procedure to check on you and address any questions or concerns that you may have at that time regarding the information given to you following your procedure. This is a courtesy call and so if there is no answer at the home number and we have not heard from you through the emergency physician on call, we will assume that you have returned to your regular daily activities without incident.  SIGNATURES/CONFIDENTIALITY: You and/or your care partner have signed paperwork which will be  entered into your electronic medical record.  These signatures attest to the fact that that the information above on your After Visit Summary has been reviewed and is understood.  Full responsibility of the confidentiality of this discharge information lies with you and/or your care-partner.

## 2011-04-10 NOTE — Op Note (Signed)
[facility] [address] [place], [place]  [address]  ENDOSCOPY PROCEDURE REPORT  PATIENT:  Xavier Campbell, Xavier Campbell  MR#:  [id number] BIRTHDATE:  1955-07-30, 55 yrs. old  GENDER:  male  ENDOSCOPIST:  Barbette Hair. Arlyce Dice, MD Referred by:  Quitman Livings, M.D.  PROCEDURE DATE:  04/10/2011 PROCEDURE:  EGD, diagnostic 43235, Maloney Dilation of Esophagus ASA CLASS:  Class II INDICATIONS:  dysphagia  MEDICATIONS:   MAC sedation, administered by CRNA propofol 150mg IV, 0.6cc simethancone 0.6 cc PO TOPICAL ANESTHETIC:  DESCRIPTION OF PROCEDURE:   After the risks and benefits of the procedure were explained, informed consent was obtained.  The LB GIF-H180 [id number] endoscope was introduced through the mouth and advanced to the third portion of the duodenum.  The instrument was slowly withdrawn as the mucosa was fully examined. <<PROCEDUREIMAGES>>  A stricture was found at the gastroesophageal junction (see image4). Early esophageal stricture Dilation with maloney dilator 18mm Mild resistance; no heme  Otherwise the examination was normal (see image2 and image3).    Retroflexed views revealed no abnormalities.    The scope was then withdrawn from the patient and the procedure completed.  COMPLICATIONS:  None  ENDOSCOPIC IMPRESSION: 1) Stricture at the gastroesophageal junction 2) Otherwise normal examination RECOMMENDATIONS: 1) OP follow-up in 4 weeks.  ______________________________ Barbette Hair Arlyce Dice, MD  CC:  n. eSIGNED:   Barbette Hair. Analeigha Nauman at 04/10/2011 03:49 PM  Nicoletta Dress, [phone]

## 2011-04-11 ENCOUNTER — Telehealth: Payer: Self-pay | Admitting: *Deleted

## 2011-04-11 NOTE — Telephone Encounter (Signed)
  Follow up Call-  Call back number 04/10/2011  Post procedure Call Back phone  # [phone]  Permission to leave phone message Yes     Patient questions:      Left message on ans. machine that we called to check on her.

## 2011-05-16 ENCOUNTER — Ambulatory Visit: Payer: Medicaid Other | Admitting: Gastroenterology

## 2011-06-12 ENCOUNTER — Ambulatory Visit (INDEPENDENT_AMBULATORY_CARE_PROVIDER_SITE_OTHER): Payer: Medicaid Other | Admitting: Gastroenterology

## 2011-06-12 ENCOUNTER — Encounter: Payer: Self-pay | Admitting: Gastroenterology

## 2011-06-12 VITALS — BP 106/68 | HR 68 | Ht 72.0 in | Wt 142.2 lb

## 2011-06-12 DIAGNOSIS — K222 Esophageal obstruction: Secondary | ICD-10-CM | POA: Insufficient documentation

## 2011-06-12 DIAGNOSIS — R1310 Dysphagia, unspecified: Secondary | ICD-10-CM

## 2011-06-12 NOTE — Patient Instructions (Addendum)
You have been given a separate informational sheet regarding your tobacco use, the importance of quitting and local resources to help you quit.  Follow up as needed 

## 2011-06-12 NOTE — Progress Notes (Signed)
History of Present Illness:  Xavier Campbell has returned following Elease Hashimoto dilatation of a distal esophageal stricture. Dysphagia has resolved. He has no GI complaints.    Review of Systems: He complains of burning discomfort radiating from the neck to both shoulders. Pertinent positive and negative review of systems were noted in the above HPI section. All other review of systems were otherwise negative.    Current Medications, Allergies, Past Medical History, Past Surgical History, Family History and Social History were reviewed in [employer] electronic medical record  Vital signs were reviewed in today's medical record. Physical Exam: General: Well developed , well nourished, no acute distress

## 2011-06-12 NOTE — Assessment & Plan Note (Addendum)
Resolved following dilatation of an esophageal stricture 

## 2011-09-03 ENCOUNTER — Encounter (HOSPITAL_COMMUNITY): Payer: Self-pay | Admitting: Pharmacy Technician

## 2011-09-03 ENCOUNTER — Other Ambulatory Visit: Payer: Self-pay | Admitting: Neurosurgery

## 2011-09-10 ENCOUNTER — Encounter (HOSPITAL_COMMUNITY)
Admission: RE | Admit: 2011-09-10 | Discharge: 2011-09-10 | Disposition: A | Discharge status: Home / Self Care | Payer: Medicaid Other | Source: Ambulatory Visit | Attending: Neurosurgery | Admitting: Neurosurgery

## 2011-09-10 ENCOUNTER — Encounter (HOSPITAL_COMMUNITY): Payer: Self-pay

## 2011-09-10 LAB — BASIC METABOLIC PANEL
BUN: 17 mg/dL (ref 6–23)
CO2: 24 mEq/L (ref 19–32)
Calcium: 9.5 mg/dL (ref 8.4–10.5)
Chloride: 106 mEq/L (ref 96–112)
Creatinine, Ser: 0.86 mg/dL (ref 0.50–1.35)
GFR calc Af Amer: >90 mL/min (ref >90)
GFR calc non Af Amer: >90 mL/min (ref >90)
Glucose, Bld: 93 mg/dL (ref 70–99)
Potassium: 4.2 mEq/L (ref 3.5–5.1)
Sodium: 141 mEq/L (ref 135–145)

## 2011-09-10 LAB — CBC
HCT: 32.4 % — ABNORMAL LOW (ref 39.0–52.0)
Hemoglobin: 11.5 g/dL — ABNORMAL LOW (ref 13.0–17.0)
MCH: 22 pg — ABNORMAL LOW (ref 26.0–34.0)
MCHC: 35.5 g/dL (ref 30.0–36.0)
MCV: 62.1 fL — ABNORMAL LOW (ref 78.0–100.0)
Platelets: 210 10*3/uL (ref 150–400)
RBC: 5.22 MIL/uL (ref 4.22–5.81)
RDW: 17.8 % — ABNORMAL HIGH (ref 11.5–15.5)
WBC: 8.7 10*3/uL (ref 4.0–10.5)

## 2011-09-10 LAB — SURGICAL PCR SCREEN
MRSA, PCR: NEGATIVE
Staphylococcus aureus: NEGATIVE

## 2011-09-10 NOTE — Pre-Procedure Instructions (Signed)
20 JAMAI DOLCE  09/10/2011   Your procedure is scheduled on:  Thursday September 13, 2011.  Report to [facility] at 0845 AM.  Call this number if you have problems the morning of surgery: [phone]   Remember:   Do not eat food or drink:After Midnight.    Take these medicines the morning of surgery with A SIP OF WATER: Escitalopram (Lexapro), Gabapentin (Neurontin), Hydrocodone if needed for pain, Omeprazole (Prilosec)   Do not wear jewelry  Do not wear lotions or colognes.  Men may shave face and neck.  Do not bring valuables to the hospital.  Contacts, dentures or bridgework may not be worn into surgery.  Leave suitcase in the car. After surgery it may be brought to your room.  For patients admitted to the hospital, checkout time is 11:00 AM the day of discharge.   Patients discharged the day of surgery will not be allowed to drive home.  Name and phone number of your driver:   Special Instructions: CHG Shower Use Special Wash: 1/2 bottle night before surgery and 1/2 bottle morning of surgery.   Please read over the following fact sheets that you were given: Pain Booklet, Coughing and Deep Breathing, MRSA Information and Surgical Site Infection Prevention

## 2011-09-10 NOTE — Progress Notes (Signed)
Nurse called Dr. Doreen Beam office and spoke with Shanda Bumps about orders not being signed. Shanda Bumps informed Nurse that Dr. Phoebe Perch was out of town and would not be in today. Nurse informed Shanda Bumps that labs and consent form would have to be done the DOS.

## 2011-09-10 NOTE — Progress Notes (Signed)
Patient denied having a stress test, cardiac cath, or sleep study.  

## 2011-09-13 ENCOUNTER — Encounter (HOSPITAL_COMMUNITY)
Admission: RE | Disposition: A | Discharge status: Home / Self Care | Payer: Self-pay | Source: Ambulatory Visit | Attending: Neurosurgery

## 2011-09-13 ENCOUNTER — Encounter (HOSPITAL_COMMUNITY): Payer: Self-pay | Admitting: Anesthesiology

## 2011-09-13 ENCOUNTER — Inpatient Hospital Stay (HOSPITAL_COMMUNITY)
Admission: RE | Admit: 2011-09-13 | Discharge: 2011-09-14 | DRG: 472 | Disposition: A | Discharge status: Home / Self Care | Payer: Medicaid Other | Source: Ambulatory Visit | Attending: Neurosurgery | Admitting: Neurosurgery

## 2011-09-13 ENCOUNTER — Ambulatory Visit (HOSPITAL_COMMUNITY): Payer: Medicaid Other

## 2011-09-13 ENCOUNTER — Ambulatory Visit (HOSPITAL_COMMUNITY): Payer: Medicaid Other | Admitting: Anesthesiology

## 2011-09-13 DIAGNOSIS — M5020 Other cervical disc displacement, unspecified cervical region: Principal | ICD-10-CM | POA: Diagnosis present

## 2011-09-13 DIAGNOSIS — M47812 Spondylosis without myelopathy or radiculopathy, cervical region: Secondary | ICD-10-CM | POA: Diagnosis present

## 2011-09-13 DIAGNOSIS — T84099A Other mechanical complication of unspecified internal joint prosthesis, initial encounter: Secondary | ICD-10-CM | POA: Diagnosis present

## 2011-09-13 DIAGNOSIS — Y831 Surgical operation with implant of artificial internal device as the cause of abnormal reaction of the patient, or of later complication, without mention of misadventure at the time of the procedure: Secondary | ICD-10-CM | POA: Diagnosis present

## 2011-09-13 DIAGNOSIS — Y92009 Unspecified place in unspecified non-institutional (private) residence as the place of occurrence of the external cause: Secondary | ICD-10-CM

## 2011-09-13 HISTORY — PX: ANTERIOR CERVICAL DECOMP/DISCECTOMY FUSION: SHX1161

## 2011-09-13 LAB — URINALYSIS, ROUTINE W REFLEX MICROSCOPIC
Bilirubin Urine: NEGATIVE
Glucose, UA: NEGATIVE mg/dL
Hgb urine dipstick: NEGATIVE
Ketones, ur: NEGATIVE mg/dL
Leukocytes, UA: NEGATIVE
Nitrite: NEGATIVE
Protein, ur: NEGATIVE mg/dL
Specific Gravity, Urine: 1.013 (ref 1.005–1.030)
Urobilinogen, UA: 1 mg/dL (ref 0.0–1.0)
pH: 5.5 (ref 5.0–8.0)

## 2011-09-13 LAB — CBC WITH DIFFERENTIAL/PLATELET
Basophils Absolute: 0 10*3/uL (ref 0.0–0.1)
Basophils Relative: 0 % (ref 0–1)
Eosinophils Absolute: 0.2 10*3/uL (ref 0.0–0.7)
Eosinophils Relative: 3 % (ref 0–5)
HCT: 30.8 % — ABNORMAL LOW (ref 39.0–52.0)
Hemoglobin: 10.8 g/dL — ABNORMAL LOW (ref 13.0–17.0)
Lymphocytes Relative: 35 % (ref 12–46)
Lymphs Abs: 2.8 10*3/uL (ref 0.7–4.0)
MCH: 21.7 pg — ABNORMAL LOW (ref 26.0–34.0)
MCHC: 35.1 g/dL (ref 30.0–36.0)
MCV: 61.8 fL — ABNORMAL LOW (ref 78.0–100.0)
Monocytes Absolute: 0.7 10*3/uL (ref 0.1–1.0)
Monocytes Relative: 9 % (ref 3–12)
Neutro Abs: 4.3 10*3/uL (ref 1.7–7.7)
Neutrophils Relative %: 53 % (ref 43–77)
Platelets: 198 10*3/uL (ref 150–400)
RBC: 4.98 MIL/uL (ref 4.22–5.81)
RDW: 17.5 % — ABNORMAL HIGH (ref 11.5–15.5)
WBC: 8 10*3/uL (ref 4.0–10.5)

## 2011-09-13 LAB — PROTIME-INR
INR: 1.08 (ref 0.00–1.49)
Prothrombin Time: 14.2 seconds (ref 11.6–15.2)

## 2011-09-13 LAB — APTT: aPTT: 30 seconds (ref 24–37)

## 2011-09-13 SURGERY — ANTERIOR CERVICAL DECOMPRESSION/DISCECTOMY FUSION 1 LEVEL
Anesthesia: General | Site: Neck | Laterality: Bilateral | Wound class: Clean

## 2011-09-13 MED ORDER — MORPHINE SULFATE 2 MG/ML IJ SOLN
1.0000 mg | INTRAMUSCULAR | Status: DC | PRN
  Administered 2011-09-13: 2 mg via INTRAVENOUS
  Filled 2011-09-13: qty 1

## 2011-09-13 MED ORDER — MAGNESIUM HYDROXIDE 400 MG/5ML PO SUSP: 30.0000 mL | Freq: Every day | ORAL | Status: DC | PRN

## 2011-09-13 MED ORDER — MIDAZOLAM HCL 2 MG/2ML IJ SOLN: 1.0000 mg | INTRAMUSCULAR | Status: DC | PRN

## 2011-09-13 MED ORDER — CYCLOBENZAPRINE HCL 10 MG PO TABS
ORAL_TABLET | ORAL | Status: AC
  Filled 2011-09-13: qty 1

## 2011-09-13 MED ORDER — ROCURONIUM BROMIDE 100 MG/10ML IV SOLN
INTRAVENOUS | Status: DC | PRN
  Administered 2011-09-13: 50 mg via INTRAVENOUS
  Administered 2011-09-13: 10 mg via INTRAVENOUS

## 2011-09-13 MED ORDER — ACETAMINOPHEN 10 MG/ML IV SOLN
INTRAVENOUS | Status: DC | PRN
  Administered 2011-09-13: 1000 mg via INTRAVENOUS

## 2011-09-13 MED ORDER — LIDOCAINE HCL (CARDIAC) 20 MG/ML IV SOLN
INTRAVENOUS | Status: DC | PRN
  Administered 2011-09-13: 100 mg via INTRAVENOUS

## 2011-09-13 MED ORDER — DOCUSATE SODIUM 100 MG PO CAPS
100.0000 mg | ORAL_CAPSULE | Freq: Two times a day (BID) | ORAL | Status: DC
  Administered 2011-09-13 – 2011-09-14 (×2): 100 mg via ORAL
  Filled 2011-09-13 (×2): qty 1

## 2011-09-13 MED ORDER — BRIMONIDINE TARTRATE 0.2 % OP SOLN
1.0000 [drp] | Freq: Two times a day (BID) | OPHTHALMIC | Status: DC
  Administered 2011-09-13: 1 [drp] via OPHTHALMIC
  Filled 2011-09-13: qty 5

## 2011-09-13 MED ORDER — ACETAMINOPHEN 10 MG/ML IV SOLN
INTRAVENOUS | Status: AC
  Filled 2011-09-13: qty 100

## 2011-09-13 MED ORDER — HYDROMORPHONE HCL PF 1 MG/ML IJ SOLN
INTRAMUSCULAR | Status: AC
  Filled 2011-09-13: qty 1

## 2011-09-13 MED ORDER — BRIMONIDINE TARTRATE-TIMOLOL 0.2-0.5 % OP SOLN: 1.0000 [drp] | Freq: Two times a day (BID) | OPHTHALMIC | Status: DC

## 2011-09-13 MED ORDER — HEMOSTATIC AGENTS (NO CHARGE) OPTIME
TOPICAL | Status: DC | PRN
  Administered 2011-09-13: 1 via TOPICAL

## 2011-09-13 MED ORDER — ONDANSETRON HCL 4 MG/2ML IJ SOLN
INTRAMUSCULAR | Status: DC | PRN
  Administered 2011-09-13: 4 mg via INTRAVENOUS

## 2011-09-13 MED ORDER — CEFAZOLIN SODIUM-DEXTROSE 2-3 GM-% IV SOLR
2.0000 g | INTRAVENOUS | Status: DC
  Administered 2011-09-13: 2 g via INTRAVENOUS

## 2011-09-13 MED ORDER — MENTHOL 3 MG MT LOZG
1.0000 | LOZENGE | OROMUCOSAL | Status: DC | PRN
  Filled 2011-09-13: qty 9

## 2011-09-13 MED ORDER — FENTANYL CITRATE 0.05 MG/ML IJ SOLN
INTRAMUSCULAR | Status: DC | PRN
  Administered 2011-09-13 (×2): 100 ug via INTRAVENOUS
  Administered 2011-09-13 (×2): 50 ug via INTRAVENOUS
  Administered 2011-09-13: 100 ug via INTRAVENOUS

## 2011-09-13 MED ORDER — BACITRACIN 50000 UNITS IM SOLR
INTRAMUSCULAR | Status: AC
  Filled 2011-09-13: qty 1

## 2011-09-13 MED ORDER — PROMETHAZINE HCL 25 MG/ML IJ SOLN: 12.5000 mg | INTRAMUSCULAR | Status: DC | PRN

## 2011-09-13 MED ORDER — PANTOPRAZOLE SODIUM 40 MG PO TBEC
40.0000 mg | DELAYED_RELEASE_TABLET | Freq: Every day | ORAL | Status: DC
  Administered 2011-09-14: 40 mg via ORAL
  Filled 2011-09-13: qty 1

## 2011-09-13 MED ORDER — METHOCARBAMOL 500 MG PO TABS: 500.0000 mg | ORAL_TABLET | Freq: Four times a day (QID) | ORAL | Status: DC | PRN

## 2011-09-13 MED ORDER — ZOLPIDEM TARTRATE 5 MG PO TABS: 5.0000 mg | ORAL_TABLET | Freq: Every evening | ORAL | Status: DC | PRN

## 2011-09-13 MED ORDER — PROPOFOL 10 MG/ML IV EMUL
INTRAVENOUS | Status: DC | PRN
  Administered 2011-09-13: 200 mg via INTRAVENOUS

## 2011-09-13 MED ORDER — ACETAMINOPHEN 10 MG/ML IV SOLN: 1000.0000 mg | Freq: Once | INTRAVENOUS | Status: DC

## 2011-09-13 MED ORDER — PROMETHAZINE HCL 25 MG PO TABS: 12.5000 mg | ORAL_TABLET | ORAL | Status: DC | PRN

## 2011-09-13 MED ORDER — LIDOCAINE-EPINEPHRINE 1 %-1:100000 IJ SOLN
INTRAMUSCULAR | Status: DC | PRN
  Administered 2011-09-13: 10 mL

## 2011-09-13 MED ORDER — ARTIFICIAL TEARS OP OINT
TOPICAL_OINTMENT | OPHTHALMIC | Status: DC | PRN
  Administered 2011-09-13: 1 via OPHTHALMIC

## 2011-09-13 MED ORDER — THROMBIN 5000 UNITS EX KIT
PACK | CUTANEOUS | Status: DC | PRN
  Administered 2011-09-13 (×2): 5000 [IU] via TOPICAL

## 2011-09-13 MED ORDER — SODIUM CHLORIDE 0.9 % IV SOLN
INTRAVENOUS | Status: AC
  Filled 2011-09-13: qty 500

## 2011-09-13 MED ORDER — NEOSTIGMINE METHYLSULFATE 1 MG/ML IJ SOLN
INTRAMUSCULAR | Status: DC | PRN
  Administered 2011-09-13: 5 mg via INTRAVENOUS

## 2011-09-13 MED ORDER — OXYCODONE-ACETAMINOPHEN 5-325 MG PO TABS
1.0000 | ORAL_TABLET | ORAL | Status: DC | PRN
  Administered 2011-09-13 – 2011-09-14 (×4): 2 via ORAL
  Filled 2011-09-13 (×4): qty 2

## 2011-09-13 MED ORDER — HYDROCODONE-ACETAMINOPHEN 5-325 MG PO TABS: 1.0000 | ORAL_TABLET | ORAL | Status: DC | PRN

## 2011-09-13 MED ORDER — TIMOLOL MALEATE 0.5 % OP SOLN
1.0000 [drp] | Freq: Two times a day (BID) | OPHTHALMIC | Status: DC
  Filled 2011-09-13: qty 5

## 2011-09-13 MED ORDER — HYDROMORPHONE HCL PF 1 MG/ML IJ SOLN
0.2500 mg | INTRAMUSCULAR | Status: DC | PRN
  Administered 2011-09-13 (×4): 0.5 mg via INTRAVENOUS

## 2011-09-13 MED ORDER — FENTANYL CITRATE 0.05 MG/ML IJ SOLN: 50.0000 ug | INTRAMUSCULAR | Status: DC | PRN

## 2011-09-13 MED ORDER — CEFAZOLIN SODIUM-DEXTROSE 2-3 GM-% IV SOLR
INTRAVENOUS | Status: AC
  Filled 2011-09-13: qty 50

## 2011-09-13 MED ORDER — BISACODYL 10 MG RE SUPP: 10.0000 mg | Freq: Every day | RECTAL | Status: DC | PRN

## 2011-09-13 MED ORDER — EPHEDRINE SULFATE 50 MG/ML IJ SOLN
INTRAMUSCULAR | Status: DC | PRN
  Administered 2011-09-13 (×2): 5 mg via INTRAVENOUS

## 2011-09-13 MED ORDER — PROMETHAZINE HCL 25 MG/ML IJ SOLN: 6.2500 mg | INTRAMUSCULAR | Status: DC | PRN

## 2011-09-13 MED ORDER — AMITRIPTYLINE HCL 100 MG PO TABS
100.0000 mg | ORAL_TABLET | Freq: Every day | ORAL | Status: DC
  Administered 2011-09-13: 100 mg via ORAL
  Filled 2011-09-13 (×2): qty 1

## 2011-09-13 MED ORDER — METHOCARBAMOL 100 MG/ML IJ SOLN
500.0000 mg | Freq: Four times a day (QID) | INTRAVENOUS | Status: DC | PRN
  Filled 2011-09-13: qty 5

## 2011-09-13 MED ORDER — ONDANSETRON HCL 4 MG/2ML IJ SOLN: 4.0000 mg | INTRAMUSCULAR | Status: DC | PRN

## 2011-09-13 MED ORDER — SODIUM CHLORIDE 0.9 % IR SOLN
Status: DC | PRN
  Administered 2011-09-13: 11:00:00

## 2011-09-13 MED ORDER — CYCLOBENZAPRINE HCL 10 MG PO TABS
10.0000 mg | ORAL_TABLET | Freq: Three times a day (TID) | ORAL | Status: DC | PRN
  Administered 2011-09-13: 10 mg via ORAL
  Filled 2011-09-13: qty 1

## 2011-09-13 MED ORDER — 0.9 % SODIUM CHLORIDE (POUR BTL) OPTIME
TOPICAL | Status: DC | PRN
  Administered 2011-09-13: 1000 mL

## 2011-09-13 MED ORDER — TRAVOPROST (BAK FREE) 0.004 % OP SOLN
1.0000 [drp] | Freq: Every day | OPHTHALMIC | Status: DC
  Administered 2011-09-13: 1 [drp] via OPHTHALMIC
  Filled 2011-09-13: qty 2.5

## 2011-09-13 MED ORDER — CEFAZOLIN SODIUM 1-5 GM-% IV SOLN
1.0000 g | Freq: Three times a day (TID) | INTRAVENOUS | Status: DC
  Administered 2011-09-13 – 2011-09-14 (×2): 1 g via INTRAVENOUS
  Filled 2011-09-13 (×3): qty 50

## 2011-09-13 MED ORDER — SODIUM CHLORIDE 0.9 % IJ SOLN: 3.0000 mL | INTRAMUSCULAR | Status: DC | PRN

## 2011-09-13 MED ORDER — GLYCOPYRROLATE 0.2 MG/ML IJ SOLN
INTRAMUSCULAR | Status: DC | PRN
  Administered 2011-09-13: .6 mg via INTRAVENOUS

## 2011-09-13 MED ORDER — MIDAZOLAM HCL 5 MG/5ML IJ SOLN
INTRAMUSCULAR | Status: DC | PRN
  Administered 2011-09-13: 2 mg via INTRAVENOUS

## 2011-09-13 MED ORDER — GABAPENTIN 400 MG PO CAPS
400.0000 mg | ORAL_CAPSULE | Freq: Four times a day (QID) | ORAL | Status: DC
  Administered 2011-09-13 (×2): 400 mg via ORAL
  Filled 2011-09-13 (×6): qty 1

## 2011-09-13 MED ORDER — SODIUM CHLORIDE 0.9 % IJ SOLN
3.0000 mL | Freq: Two times a day (BID) | INTRAMUSCULAR | Status: DC
  Administered 2011-09-13 (×2): 3 mL via INTRAVENOUS

## 2011-09-13 MED ORDER — DEXAMETHASONE SODIUM PHOSPHATE 4 MG/ML IJ SOLN
INTRAMUSCULAR | Status: DC | PRN
  Administered 2011-09-13: 10 mg via INTRAVENOUS

## 2011-09-13 MED ORDER — KETOROLAC TROMETHAMINE 30 MG/ML IJ SOLN
30.0000 mg | Freq: Four times a day (QID) | INTRAMUSCULAR | Status: DC
  Administered 2011-09-13 – 2011-09-14 (×3): 30 mg via INTRAVENOUS
  Filled 2011-09-13 (×4): qty 1

## 2011-09-13 MED ORDER — ESCITALOPRAM OXALATE 20 MG PO TABS
20.0000 mg | ORAL_TABLET | Freq: Every day | ORAL | Status: DC
  Filled 2011-09-13: qty 1

## 2011-09-13 MED ORDER — PHENOL 1.4 % MT LIQD
1.0000 | OROMUCOSAL | Status: DC | PRN
  Administered 2011-09-14: 1 via OROMUCOSAL
  Filled 2011-09-13: qty 177

## 2011-09-13 MED ORDER — ACETAMINOPHEN 650 MG RE SUPP: 650.0000 mg | RECTAL | Status: DC | PRN

## 2011-09-13 MED ORDER — ACETAMINOPHEN 325 MG PO TABS: 650.0000 mg | ORAL_TABLET | ORAL | Status: DC | PRN

## 2011-09-13 MED ORDER — LACTATED RINGERS IV SOLN: INTRAVENOUS | Status: DC

## 2011-09-13 MED ORDER — LACTATED RINGERS IV SOLN
INTRAVENOUS | Status: DC | PRN
  Administered 2011-09-13 (×3): via INTRAVENOUS

## 2011-09-13 SURGICAL SUPPLY — 56 items
APL SKNCLS STERI-STRIP NONHPOA (GAUZE/BANDAGES/DRESSINGS) ×1
BAG DECANTER FOR FLEXI CONT (MISCELLANEOUS) ×2 IMPLANT
BANDAGE GAUZE ELAST BULKY 4 IN (GAUZE/BANDAGES/DRESSINGS) ×4 IMPLANT
BENZOIN TINCTURE PRP APPL 2/3 (GAUZE/BANDAGES/DRESSINGS) ×2 IMPLANT
BIT DRILL 14MM (INSTRUMENTS) IMPLANT
BONE CERV LORDOTIC 14.5X12X6 (Bone Implant) ×2 IMPLANT
BUR MATCHSTICK NEURO 3.0 LAGG (BURR) ×2 IMPLANT
CANISTER SUCTION 2500CC (MISCELLANEOUS) ×2 IMPLANT
CLOTH BEACON ORANGE TIMEOUT ST (SAFETY) ×2 IMPLANT
CONT SPEC 4OZ CLIKSEAL STRL BL (MISCELLANEOUS) ×2 IMPLANT
DRAPE LAPAROTOMY 100X72 PEDS (DRAPES) ×2 IMPLANT
DRAPE MICROSCOPE LEICA (MISCELLANEOUS) ×2 IMPLANT
DRAPE POUCH INSTRU U-SHP 10X18 (DRAPES) ×2 IMPLANT
DRILL 14MM (INSTRUMENTS) ×2
DURAPREP 6ML APPLICATOR 50/CS (WOUND CARE) ×2 IMPLANT
ELECT REM PT RETURN 9FT ADLT (ELECTROSURGICAL) ×2
ELECTRODE REM PT RTRN 9FT ADLT (ELECTROSURGICAL) ×1 IMPLANT
GAUZE SPONGE 4X4 16PLY XRAY LF (GAUZE/BANDAGES/DRESSINGS) ×1 IMPLANT
GLOVE BIOGEL PI IND STRL 7.5 (GLOVE) IMPLANT
GLOVE BIOGEL PI IND STRL 8 (GLOVE) IMPLANT
GLOVE BIOGEL PI INDICATOR 7.5 (GLOVE) ×1
GLOVE BIOGEL PI INDICATOR 8 (GLOVE) ×1
GLOVE ECLIPSE 7.5 STRL STRAW (GLOVE) ×7 IMPLANT
GLOVE EXAM NITRILE LRG STRL (GLOVE) IMPLANT
GLOVE EXAM NITRILE MD LF STRL (GLOVE) IMPLANT
GLOVE EXAM NITRILE XL STR (GLOVE) IMPLANT
GLOVE EXAM NITRILE XS STR PU (GLOVE) IMPLANT
GOWN BRE IMP SLV AUR LG STRL (GOWN DISPOSABLE) ×2 IMPLANT
GOWN BRE IMP SLV AUR XL STRL (GOWN DISPOSABLE) ×2 IMPLANT
GOWN STRL REIN 2XL LVL4 (GOWN DISPOSABLE) ×1 IMPLANT
GRAFT BNE SPCR VG2 14.5X12X6 (Bone Implant) IMPLANT
HEAD HALTER (SOFTGOODS) ×2 IMPLANT
KIT BASIN OR (CUSTOM PROCEDURE TRAY) ×2 IMPLANT
KIT ROOM TURNOVER OR (KITS) ×2 IMPLANT
NDL HYPO 25X1 1.5 SAFETY (NEEDLE) ×1 IMPLANT
NDL SPNL 20GX3.5 QUINCKE YW (NEEDLE) ×1 IMPLANT
NEEDLE HYPO 22GX1.5 SAFETY (NEEDLE) IMPLANT
NEEDLE HYPO 25X1 1.5 SAFETY (NEEDLE) ×2 IMPLANT
NEEDLE SPNL 20GX3.5 QUINCKE YW (NEEDLE) ×2 IMPLANT
NS IRRIG 1000ML POUR BTL (IV SOLUTION) ×2 IMPLANT
PACK LAMINECTOMY NEURO (CUSTOM PROCEDURE TRAY) ×2 IMPLANT
PAD ARMBOARD 7.5X6 YLW CONV (MISCELLANEOUS) ×6 IMPLANT
PATTIES SURGICAL .75X.75 (GAUZE/BANDAGES/DRESSINGS) ×2 IMPLANT
PIN DISTRACTION 14MM (PIN) ×4 IMPLANT
PLATE 14MM (Plate) ×1 IMPLANT
RUBBERBAND STERILE (MISCELLANEOUS) ×4 IMPLANT
SCREW 14MM (Screw) ×4 IMPLANT
SPONGE GAUZE 4X4 12PLY (GAUZE/BANDAGES/DRESSINGS) ×2 IMPLANT
SPONGE INTESTINAL PEANUT (DISPOSABLE) ×2 IMPLANT
STRIP CLOSURE SKIN 1/2X4 (GAUZE/BANDAGES/DRESSINGS) ×2 IMPLANT
SUT VIC AB 3-0 SH 8-18 (SUTURE) ×2 IMPLANT
SYR 20ML ECCENTRIC (SYRINGE) ×2 IMPLANT
TAPE CLOTH SURG 4X10 WHT LF (GAUZE/BANDAGES/DRESSINGS) ×2 IMPLANT
TOWEL OR 17X24 6PK STRL BLUE (TOWEL DISPOSABLE) ×2 IMPLANT
TOWEL OR 17X26 10 PK STRL BLUE (TOWEL DISPOSABLE) ×2 IMPLANT
WATER STERILE IRR 1000ML POUR (IV SOLUTION) ×2 IMPLANT

## 2011-09-13 NOTE — Plan of Care (Signed)
Problem: Consults Goal: Diagnosis - Spinal Surgery Outcome: Completed/Met Date Met:  09/13/11 Cervical Spine Fusion     

## 2011-09-13 NOTE — Op Note (Signed)
09/13/2011  1:16 PM  PATIENT:  Xavier Campbell  56 y.o. male  PRE-OPERATIVE DIAGNOSIS:  Cervical hnp with myelopathy, Cervical spondylosis with myelopathy, Cervical stenosis  POST-OPERATIVE DIAGNOSIS:  Cervical hnp with myelopathy, Cervical spondylosis with myelopathy, Cervical stenosis  PROCEDURE:  Procedure(s): ANTERIOR CERVICAL DECOMPRESSION/DISCECTOMY FUSION 1 LEVEL C3-4, lifenet bone, trestle plate  SURGEON:  Surgeon(s): Clydene Fake, MD Reinaldo Meeker, MD-assist    ANESTHESIA:   general  EBL:  Total I/O In: 2000 [I.V.:2000] Out: -   BLOOD ADMINISTERED:none  DRAINS: none   SPECIMEN:  No Specimen  DICTATION: Patient has had 3 level ACF back in 2001. He  present with neck pain and tingling  complaints but on exam is myelopathic spasticity on theupper and lower extremities. MRI was done showing retrolisthesis at C3-4 with cord compression stenosis. Patient brought in for ACF at C3-4.  Patient was brought into the operating room general anesthesia was induced patient placed in 10 pounds halter traction and prepped draped sterile fashion site of incision injected with 10 cc 1% lidocaine with epinephrine. Incision was then made from the midline to the anterior border of the sternocleidomastoid muscle the left-sided neck incision taken out of the platysma hemostasis obtained with Bovie cauterization the platysma was incised with the Bovie and blunt dissection taken to the intracerebral fascia to the anterior cervical spine. The previous plate was found and dissected to the interspace at about removed osteophytes incise the disc space placed a needle in that the space took an x-ray showing this was 34 level. The space was then incised and discectomy started with pituitary rongeurs and curettes along with contrast which removed distraction pins placed in C3 and C4 the interspace distracted microscope brought in for microdissection at this point. Curettes and Kerrison punches remove  posterior osteophytes and disc decompressing the cord and foraminotomies done. I displaced the medullary 4 mm in the formal LifeNet allograft bone tapped in place distraction distraction pins removed weight was removed the traction a Tressel anterior cervical plate posterior and anterior cervical spine and 2 screws the screws bone plug. Retractors removed hemostasis bipolar cauterization Gelfoam thrombin. About solution and hemostasis platysma closed through Vicryl interrupted sutures subcutaneous tissue closed with the same skin closed benzoin Steri-Strips dressing placed patient woken up from anesthesia and transferred recovery placed the C3-C4 these were tightened final x-rays showing good position  PLAN OF CARE: Admit to inpatient   PATIENT DISPOSITION:  PACU - hemodynamically stable.

## 2011-09-13 NOTE — Transfer of Care (Signed)
Immediate Anesthesia Transfer of Care Note  Patient: Xavier Campbell  Procedure(s) Performed: Procedure(s) (LRB): ANTERIOR CERVICAL DECOMPRESSION/DISCECTOMY FUSION 1 LEVEL (Bilateral)  Patient Location: PACU  Anesthesia Type: General  Level of Consciousness: sedated and patient cooperative  Airway & Oxygen Therapy: Patient Spontanous Breathing and Patient connected to face mask oxygen  Post-op Assessment: Report given to PACU RN and Patient moving all extremities X 4  Post vital signs: Reviewed and stable  Complications: No apparent anesthesia complications

## 2011-09-13 NOTE — H&P (Signed)
See H& P.

## 2011-09-13 NOTE — Preoperative (Signed)
Beta Blockers   Reason not to administer Beta Blockers:Not Applicable 

## 2011-09-13 NOTE — Anesthesia Procedure Notes (Signed)
Procedure Name: Intubation Date/Time: 09/13/2011 10:36 AM Performed by: Sherie Don Pre-anesthesia Checklist: Patient identified, Emergency Drugs available, Suction available, Patient being monitored and Timeout performed Patient Re-evaluated:Patient Re-evaluated prior to inductionOxygen Delivery Method: Circle system utilized Preoxygenation: Pre-oxygenation with 100% oxygen Intubation Type: IV induction Ventilation: Mask ventilation without difficulty and Oral airway inserted - appropriate to patient size Laryngoscope Size: 3 and Mac Grade View: Grade II Tube type: Oral Tube size: 7.5 mm Number of attempts: 1 Airway Equipment and Method: Stylet Placement Confirmation: ETT inserted through vocal cords under direct vision,  positive ETCO2 and breath sounds checked- equal and bilateral Secured at: 23 cm Tube secured with: Tape Dental Injury: Teeth and Oropharynx as per pre-operative assessment

## 2011-09-13 NOTE — Interval H&P Note (Signed)
History and Physical Interval Note:  09/13/2011 10:07 AM  Xavier Campbell  has presented today for surgery, with the diagnosis of Cervical hnp with myelopathy, Cervical spondylosis with myelopathy, Cervical stenosis  The various methods of treatment have been discussed with the patient and family. After consideration of risks, benefits and other options for treatment, the patient has consented to  Procedure(s) (LRB): ANTERIOR CERVICAL DECOMPRESSION/DISCECTOMY FUSION 1 LEVEL (N/A) as a surgical intervention .  The patient's history has been reviewed, patient examined, no change in status, stable for surgery.  I have reviewed the patient's chart and labs.  Questions were answered to the patient's satisfaction.     Madine Sarr R

## 2011-09-13 NOTE — Anesthesia Postprocedure Evaluation (Signed)
  Anesthesia Post-op Note  Patient: Xavier Campbell  Procedure(s) Performed: Procedure(s) (LRB): ANTERIOR CERVICAL DECOMPRESSION/DISCECTOMY FUSION 1 LEVEL (Bilateral)  Patient Location: PACU  Anesthesia Type: General  Level of Consciousness: awake and alert   Airway and Oxygen Therapy: Patient Spontanous Breathing  Post-op Pain: mild  Post-op Assessment: Post-op Vital signs reviewed, Patient's Cardiovascular Status Stable, Respiratory Function Stable, Patent Airway, No signs of Nausea or vomiting and Pain level controlled  Post-op Vital Signs: stable  Complications: No apparent anesthesia complications

## 2011-09-13 NOTE — Anesthesia Preprocedure Evaluation (Addendum)
Anesthesia Evaluation  Patient identified by MRN, date of birth, ID band Patient awake    Reviewed: Allergy & Precautions, H&P , NPO status , Patient's Chart, lab work & pertinent test results  Airway Mallampati: I TM Distance: >3 FB Neck ROM: Full    Dental   Pulmonary COPDCurrent Smoker,  breath sounds clear to auscultation        Cardiovascular hypertension, Rhythm:Regular Rate:Normal     Neuro/Psych Depression  Neuromuscular disease    GI/Hepatic GERD-  Controlled,(+)     substance abuse  alcohol use, cocaine use and marijuana use,   Endo/Other    Renal/GU      Musculoskeletal   Abdominal   Peds  Hematology   Anesthesia Other Findings   Reproductive/Obstetrics                          Anesthesia Physical Anesthesia Plan  ASA: III  Anesthesia Plan: General   Post-op Pain Management:    Induction: Intravenous  Airway Management Planned: Oral ETT  Additional Equipment:   Intra-op Plan:   Post-operative Plan: Extubation in OR  Informed Consent: I have reviewed the patients History and Physical, chart, labs and discussed the procedure including the risks, benefits and alternatives for the proposed anesthesia with the patient or authorized representative who has indicated his/her understanding and acceptance.     Plan Discussed with: CRNA and Surgeon  Anesthesia Plan Comments:        Anesthesia Quick Evaluation

## 2011-09-14 ENCOUNTER — Encounter (HOSPITAL_COMMUNITY): Payer: Self-pay | Admitting: Neurosurgery

## 2011-09-14 MED ORDER — OXYCODONE-ACETAMINOPHEN 5-325 MG PO TABS: 1.0000 | ORAL_TABLET | ORAL | Status: AC | PRN

## 2011-09-14 NOTE — Progress Notes (Signed)
Utilization review completed. Shandora Koogler, RN, BSN. 

## 2011-09-14 NOTE — Discharge Summary (Signed)
Physician Discharge Summary  Patient ID: CHIGOZIE BASALDUA MRN: [id number] DOB/AGE: Jun 10, 1955 56 y.o.  Admit date: 09/13/2011 Discharge date: 09/14/2011  Admission Diagnoses:Cervical hnp with myelopathy, Cervical spondylosis with myelopathy, Cervical stenosis      Discharge Diagnoses: Cervical hnp with myelopathy, Cervical spondylosis with myelopathy, Cervical stenosis     Active Problems:  * No active hospital problems. *    Discharged Condition: good  Hospital Course: pt admitted day of surgery and underwent procedure below.  Pt ambulating, eating, voice ok - moving all well  Consults: None  Significant Diagnostic Studies: none  Treatments: surgery: ANTERIOR CERVICAL DECOMPRESSION/DISCECTOMY FUSION 1 LEVEL C3-4, lifenet bone, trestle plate   Discharge Exam: Blood pressure 145/92, pulse 67, temperature 98.3 F (36.8 C), temperature source Oral, resp. rate 18, SpO2 96.00%. Wound:c/d/i  Disposition: home   Medication List  As of 09/14/2011  8:43 AM   TAKE these medications         amitriptyline 100 MG tablet   Commonly known as: ELAVIL   Take 100 mg by mouth at bedtime.      COMBIGAN 0.2-0.5 % ophthalmic solution   Generic drug: brimonidine-timolol   Place 1 drop into the left eye every 12 (twelve) hours.      escitalopram 20 MG tablet   Commonly known as: LEXAPRO   Take 20 mg by mouth daily.      gabapentin 400 MG capsule   Commonly known as: NEURONTIN   Take 400 mg by mouth 4 (four) times daily.      HYDROcodone-acetaminophen 5-325 MG per tablet   Commonly known as: NORCO/VICODIN   Take 1 tablet by mouth every 6 (six) hours as needed. For pain      omeprazole 20 MG capsule   Commonly known as: PRILOSEC   Take 20 mg by mouth daily.      oxyCODONE-acetaminophen 5-325 MG per tablet   Commonly known as: PERCOCET/ROXICET   Take 1-2 tablets by mouth every 4 (four) hours as needed for pain.      Travoprost (BAK Free) 0.004 % Soln ophthalmic solution   Commonly known as: TRAVATAN   Place 1 drop into both eyes at bedtime.             SignedClydene Fake, MD 09/14/2011, 8:43 AM

## 2012-03-14 ENCOUNTER — Encounter: Payer: Self-pay | Admitting: Neurology

## 2012-03-14 DIAGNOSIS — R209 Unspecified disturbances of skin sensation: Secondary | ICD-10-CM

## 2012-03-14 DIAGNOSIS — M5020 Other cervical disc displacement, unspecified cervical region: Secondary | ICD-10-CM | POA: Insufficient documentation

## 2012-04-15 ENCOUNTER — Ambulatory Visit (INDEPENDENT_AMBULATORY_CARE_PROVIDER_SITE_OTHER): Payer: Medicaid Other | Admitting: Neurology

## 2012-04-15 ENCOUNTER — Encounter: Payer: Self-pay | Admitting: Neurology

## 2012-04-15 VITALS — BP 110/74 | HR 90 | Temp 97.8°F | Ht 73.0 in | Wt 149.0 lb

## 2012-04-15 DIAGNOSIS — R209 Unspecified disturbances of skin sensation: Secondary | ICD-10-CM

## 2012-04-15 DIAGNOSIS — R200 Anesthesia of skin: Secondary | ICD-10-CM | POA: Insufficient documentation

## 2012-04-15 NOTE — Patient Instructions (Addendum)
He was advised not to restart gabapentin as he was not having any significant numbness on his shoulders now following his second neck surgery. He was advised to follow up with his primary physician for his muscle and joint pains. No followup is necessary with me

## 2012-04-15 NOTE — Progress Notes (Signed)
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  Returns for followup of her last visit on 11/15/2011. He states improvement in his shoulder numbness and paresthesias which are practically disappeared. He did undergo a repeat neck surgery by Dr. Phoebe Perch 6 months ago with the M.D. will approach. He had previously been on carboplatin 603 times a day but he ran out of it 2 months ago and has not refilled his prescription. He does complain of muscle aches and pains but his primary physician is treating him with hydrocodone which seems to help. He denies any difficulty swallowing. He does have some mild gait imbalance but has not had any recent falls. He denies neck pain or radicular pain. He has no new neurological complaints.   ROS: 14 system review of systems is positive for fatigue, trouble swallowing, loss of vision, increased thirst, joint pain, allergies, dizziness and depression. Physical Exam General: well developed, well nourished, seated, in no evident distress Head: head normocephalic and atraumatic. Orohparynx benign Neck: supple with no carotid or supraclavicular bruits Cardiovascular: regular rate and rhythm, no murmurs Musculoskeletal no deformity Skin anterior neck surgical scar  Neurologic Exam Mental Status: Awake and fully alert. Oriented to place and time. Recent and remote memory intact. Attention span, concentration and fund of knowledge appropriate. Mood and affect appropriate.  Cranial Nerves: Fundoscopic exam reveals sharp disc margins. Pupils equal, briskly reactive to light. Extraocular movements full without nystagmus. Visual fields full to confrontation. Hearing intact and symmetric to finer snap. Facial sensation intact. Face, tongue, palate move normally and symmetrically. Neck flexion and  extension normal.  Motor: Normal bulk and tone. Normal strength in all tested extremity muscles. Sensory.: intact to tough and pinprick and vibratory.  Coordination: Rapid alternating movements normal in all extremities. Finger-to-nose and heel-to-shin performed accurately bilaterally. Gait and Station: Arises from chair without difficulty. Stance is normal. Gait demonstrates normal stride length and balance .Not able to heel, toe and tandem walk without difficulty.  Reflexes: 1+ and symmetric. Toes downgoing.     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 He is having some musculoskeletal pain for which his primary physician is treating him with good results. I do not recommend any further neurological testing, treatment or followup at this point. He was advised to follow up with Dr. Phoebe Perch for his neck problems and no followup appointment is necessary with me

## 2013-09-24 ENCOUNTER — Emergency Department (HOSPITAL_COMMUNITY)
Admission: EM | Admit: 2013-09-24 | Discharge: 2013-09-24 | Disposition: A | Discharge status: Home / Self Care | Payer: Medicaid Other | Attending: Emergency Medicine | Admitting: Emergency Medicine

## 2013-09-24 ENCOUNTER — Emergency Department (HOSPITAL_COMMUNITY): Payer: Medicaid Other

## 2013-09-24 ENCOUNTER — Encounter (HOSPITAL_COMMUNITY): Payer: Self-pay | Admitting: Emergency Medicine

## 2013-09-24 DIAGNOSIS — F17200 Nicotine dependence, unspecified, uncomplicated: Secondary | ICD-10-CM | POA: Diagnosis not present

## 2013-09-24 DIAGNOSIS — Z87440 Personal history of urinary (tract) infections: Secondary | ICD-10-CM | POA: Insufficient documentation

## 2013-09-24 DIAGNOSIS — R6889 Other general symptoms and signs: Secondary | ICD-10-CM | POA: Insufficient documentation

## 2013-09-24 DIAGNOSIS — F329 Major depressive disorder, single episode, unspecified: Secondary | ICD-10-CM | POA: Insufficient documentation

## 2013-09-24 DIAGNOSIS — H409 Unspecified glaucoma: Secondary | ICD-10-CM | POA: Diagnosis not present

## 2013-09-24 DIAGNOSIS — Z79899 Other long term (current) drug therapy: Secondary | ICD-10-CM | POA: Insufficient documentation

## 2013-09-24 DIAGNOSIS — F3289 Other specified depressive episodes: Secondary | ICD-10-CM | POA: Diagnosis not present

## 2013-09-24 DIAGNOSIS — Z87898 Personal history of other specified conditions: Secondary | ICD-10-CM | POA: Insufficient documentation

## 2013-09-24 DIAGNOSIS — M1990 Unspecified osteoarthritis, unspecified site: Secondary | ICD-10-CM | POA: Diagnosis not present

## 2013-09-24 DIAGNOSIS — Z8719 Personal history of other diseases of the digestive system: Secondary | ICD-10-CM | POA: Diagnosis not present

## 2013-09-24 DIAGNOSIS — Z862 Personal history of diseases of the blood and blood-forming organs and certain disorders involving the immune mechanism: Secondary | ICD-10-CM | POA: Insufficient documentation

## 2013-09-24 DIAGNOSIS — R1310 Dysphagia, unspecified: Secondary | ICD-10-CM | POA: Diagnosis not present

## 2013-09-24 HISTORY — DX: Essential (primary) hypertension: I10

## 2013-09-24 LAB — I-STAT CHEM 8, ED
BUN: 11 mg/dL (ref 6–23)
Calcium, Ion: 1.22 mmol/L (ref 1.12–1.23)
Chloride: 108 mEq/L (ref 96–112)
Creatinine, Ser: 0.7 mg/dL (ref 0.50–1.35)
Glucose, Bld: 100 mg/dL — ABNORMAL HIGH (ref 70–99)
HCT: 36 % — ABNORMAL LOW (ref 39.0–52.0)
Hemoglobin: 12.2 g/dL — ABNORMAL LOW (ref 13.0–17.0)
Potassium: 3.7 mEq/L (ref 3.7–5.3)
Sodium: 143 mEq/L (ref 137–147)
TCO2: 25 mmol/L (ref 0–100)

## 2013-09-24 LAB — CBC WITH DIFFERENTIAL/PLATELET
Basophils Absolute: 0 10*3/uL (ref 0.0–0.1)
Basophils Relative: 0 % (ref 0–1)
Eosinophils Absolute: 0.3 10*3/uL (ref 0.0–0.7)
Eosinophils Relative: 3 % (ref 0–5)
HCT: 32.6 % — ABNORMAL LOW (ref 39.0–52.0)
Hemoglobin: 11.6 g/dL — ABNORMAL LOW (ref 13.0–17.0)
Lymphocytes Relative: 36 % (ref 12–46)
Lymphs Abs: 3.2 10*3/uL (ref 0.7–4.0)
MCH: 22.4 pg — ABNORMAL LOW (ref 26.0–34.0)
MCHC: 35.6 g/dL (ref 30.0–36.0)
MCV: 63.1 fL — ABNORMAL LOW (ref 78.0–100.0)
Monocytes Absolute: 0.5 10*3/uL (ref 0.1–1.0)
Monocytes Relative: 6 % (ref 3–12)
Neutro Abs: 4.9 10*3/uL (ref 1.7–7.7)
Neutrophils Relative %: 55 % (ref 43–77)
Platelets: 205 10*3/uL (ref 150–400)
RBC: 5.17 MIL/uL (ref 4.22–5.81)
RDW: 16.9 % — ABNORMAL HIGH (ref 11.5–15.5)
WBC: 8.9 10*3/uL (ref 4.0–10.5)

## 2013-09-24 LAB — I-STAT TROPONIN, ED: Troponin i, poc: 0 ng/mL (ref 0.00–0.08)

## 2013-09-24 LAB — COMPREHENSIVE METABOLIC PANEL
ALT: 30 U/L (ref 0–53)
AST: 25 U/L (ref 0–37)
Albumin: 4.2 g/dL (ref 3.5–5.2)
Alkaline Phosphatase: 70 U/L (ref 39–117)
Anion gap: 14 (ref 5–15)
BUN: 12 mg/dL (ref 6–23)
CO2: 24 mEq/L (ref 19–32)
Calcium: 9.3 mg/dL (ref 8.4–10.5)
Chloride: 107 mEq/L (ref 96–112)
Creatinine, Ser: 0.68 mg/dL (ref 0.50–1.35)
GFR calc Af Amer: >90 mL/min (ref >90)
GFR calc non Af Amer: >90 mL/min (ref >90)
Glucose, Bld: 100 mg/dL — ABNORMAL HIGH (ref 70–99)
Potassium: 4 mEq/L (ref 3.7–5.3)
Sodium: 145 mEq/L (ref 137–147)
Total Bilirubin: 0.5 mg/dL (ref 0.3–1.2)
Total Protein: 7.6 g/dL (ref 6.0–8.3)

## 2013-09-24 MED ORDER — IOHEXOL 300 MG/ML  SOLN
75.0000 mL | Freq: Once | INTRAMUSCULAR | Status: AC | PRN
  Administered 2013-09-24: 75 mL via INTRAVENOUS

## 2013-09-24 MED ORDER — SODIUM CHLORIDE 0.9 % IV BOLUS (SEPSIS)
1000.0000 mL | Freq: Once | INTRAVENOUS | Status: AC
  Administered 2013-09-24: 500 mL via INTRAVENOUS

## 2013-09-24 NOTE — Discharge Instructions (Signed)
Continue taking prilosec.   Follow up with GI doctor.   Return to ER if you have trouble swallowing, vomiting.

## 2013-09-24 NOTE — ED Notes (Signed)
Patient has not returned from MRI

## 2013-09-24 NOTE — ED Provider Notes (Signed)
CSN: [id number]     Arrival date & time 09/24/13  1050 History   First MD Initiated Contact with Patient 09/24/13 1134     Chief Complaint  Patient presents with  . Choking     (Consider location/radiation/quality/duration/timing/severity/associated sxs/prior Treatment) The history is provided by the patient.  Xavier Campbell is a 58 y.o. male hx of guillain barre, previous cervical fusion here with trouble swallowing and chocking episodes. Has trouble swallowing for several weeks. He felt like he would chock even when he is not eating food. Able to eat and drink normally. Has chronic arm numbness that is not getting worse. Denies worsening weakness or fevers.    Past Medical History  Diagnosis Date  . Arthritis   . Hypertrophy of prostate without urinary obstruction and other lower urinary tract symptoms (LUTS)   . Osteoarthritis   . Lipoma   . Alcoholic     former treatment in 2009  . Drug abuse     hx of crack abuse  . GERD (gastroesophageal reflux disease)   . Allergy     seasonal  . Cataract     bilateral  . Glaucoma   . 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 Dizziness - light-headed     if patient stands up to fast  . Depression   . Guillain-Barre syndrome   . Shortness of breath     at times with ambulation   Past Surgical History  Procedure Laterality Date  . Neck surgery    . Appendectomy    . Cataract extraction    . Cervical fusion    . Hernia repair    . Colonoscopy    . Anterior cervical decomp/discectomy fusion  09/13/2011    Procedure: ANTERIOR CERVICAL DECOMPRESSION/DISCECTOMY FUSION 1 LEVEL;  Surgeon: Otilio Connors, MD;  Location: [facility];  Service: Neurosurgery;  Laterality: Bilateral;  Anterior Cervical Three-Four Decompression/Diskectomy and fusion  . Orbital fx repair      CLEARED FOR MRI.   Family History  Problem Relation Age of Onset  . Heart disease Father   . Heart disease Mother   . Diabetes Mother   . Colon cancer Neg Hx     History  Substance Use Topics  . Smoking status: Current Every Day Smoker -- 0.25 packs/day for 40 years  . Smokeless tobacco: Never Used     Comment: Counseling sheet given 03-2011  . Alcohol Use: No    Review of Systems  HENT: Positive for trouble swallowing.   All other systems reviewed and are negative.     Allergies  Review of patient's allergies indicates no known allergies.  Home Medications   Prior to Admission medications   Medication Sig Start Date End Date Taking? Authorizing Provider  amitriptyline (ELAVIL) 100 MG tablet Take 100 mg by mouth at bedtime.   Yes Historical Provider, MD  brimonidine-timolol (COMBIGAN) 0.2-0.5 % ophthalmic solution Place 1 drop into the left eye every 12 (twelve) hours.   Yes Historical Provider, MD  escitalopram (LEXAPRO) 20 MG tablet Take 20 mg by mouth daily.   Yes Historical Provider, MD  gabapentin (NEURONTIN) 600 MG tablet Take 600 mg by mouth 3 (three) times daily.   Yes Historical Provider, MD  HYDROcodone-acetaminophen (NORCO) 5-325 MG per tablet Take 1 tablet by mouth every 6 (six) hours as needed. For pain   Yes Historical Provider, MD  lisinopril (PRINIVIL,ZESTRIL) 10 MG tablet Take 10 mg by mouth daily.  Yes Historical Provider, MD  omeprazole (PRILOSEC) 20 MG capsule Take 20 mg by mouth daily.   Yes Historical Provider, MD  Travoprost, BAK Free, (TRAVATAN) 0.004 % SOLN ophthalmic solution Place 1 drop into both eyes at bedtime.   Yes Historical Provider, MD   BP 146/96  Pulse 67  Temp(Src) 98.1 F (36.7 C) (Oral)  Resp 19  SpO2 100% Physical Exam  Nursing note and vitals reviewed. Constitutional: He is oriented to person, place, and time. He appears well-developed and well-nourished.  HENT:  Head: Normocephalic.  Mouth/Throat: Oropharynx is clear and moist.  Eyes: Conjunctivae and EOM are normal. Pupils are equal, round, and reactive to light.  Neck: Neck supple.  No stridor  Cardiovascular: Normal rate, regular  rhythm and normal heart sounds.   Pulmonary/Chest: Effort normal and breath sounds normal. No respiratory distress. He has no wheezes.  Abdominal: Soft. Bowel sounds are normal. He exhibits no distension. There is no tenderness. There is no rebound.  Musculoskeletal: Normal range of motion. He exhibits no edema and no tenderness.  Neurological: He is alert and oriented to person, place, and time. No cranial nerve deficit. Coordination normal.  Nl strength throughout. Dec sensation bilateral arms (chronic)   Skin: Skin is warm and dry.  Psychiatric: He has a normal mood and affect. His behavior is normal. Judgment and thought content normal.    ED Course  Procedures (including critical care time) Labs Review Labs Reviewed  CBC WITH DIFFERENTIAL - Abnormal; Notable for the following:    Hemoglobin 11.6 (*)    HCT 32.6 (*)    MCV 63.1 (*)    MCH 22.4 (*)    RDW 16.9 (*)    All other components within normal limits  COMPREHENSIVE METABOLIC PANEL - Abnormal; Notable for the following:    Glucose, Bld 100 (*)    All other components within normal limits  I-STAT CHEM 8, ED - Abnormal; Notable for the following:    Glucose, Bld 100 (*)    Hemoglobin 12.2 (*)    HCT 36.0 (*)    All other components within normal limits  I-STAT TROPOININ, ED    Imaging Review Dg Neck Soft Tissue  09/24/2013   CLINICAL DATA:  Choking feeling.  EXAM: NECK SOFT TISSUES - 1+ VIEW  COMPARISON:  Intraoperative radiograph 09/13/2011.  FINDINGS: Patient is status post ACDF at C3-4, C4-5, C5-6, and C6-7. The airway is displaced anteriorly at the level of C6 and C7. This may have been present on the previous study as well. The airway is patent. The lung apices are clear. The hardware is intact.  IMPRESSION: 1. Anterior displacement of the airway at the level of C6 and C7. Recommend CT of the neck with contrast to evaluate for possible soft tissue or fluid anterior to the fusion. 2. ACDF C3-4, C4-5, C5-6, and C6-7.    Electronically Signed   By: Lawrence Santiago M.D.   On: 09/24/2013 12:27   Dg Chest 2 View  09/24/2013   CLINICAL DATA:  Choking sensation. Cervical spine surgery 1 year ago.  EXAM: CHEST  2 VIEW  COMPARISON:  Two-view chest 09/10/2011  FINDINGS: The heart size is normal. Mild interstitial coarsening appears chronic. No focal airspace disease is evident. The visualized soft tissues and bony thorax are unremarkable.  IMPRESSION: 1. No acute cardiopulmonary disease or significant interval change. 2. Stable interstitial coarsening, likely chronic.   Electronically Signed   By: Lawrence Santiago M.D.   On: 09/24/2013 12:18  Mr Cervical Spine Wo Contrast  09/24/2013   CLINICAL DATA:  History of prior cervical spine operations. Difficulty swallowing and difficulty breathing. Numbness of the index finger bilaterally. Cervical cord compression.  EXAM: MRI CERVICAL SPINE WITHOUT CONTRAST  TECHNIQUE: Multiplanar, multisequence MR imaging of the cervical spine was performed. No intravenous contrast was administered.  COMPARISON:  Radiographs 09/24/2013.  MRI 04/21/2009.  FINDINGS: Alignment: Straightening of the normal cervical lordosis associated with C3 through C7 fusion. Disc contiguous ACDF plates are present. The fusion appears solid without complicating features.  Vertebrae: Bone marrow signal shows heterogenous marrow. This is a nonspecific finding most commonly associated with obesity, anemia, cigarette smoking or chronic disease. This heterogeneous bone marrow is in the vertebral bodies adjacent to the fusion. Metal artifact obscures marrow signal at the fusion levels.  Cord: Normal.  No edema, cord compression or intramedullary lesions.  Posterior Fossa: Normal.  Vertebral Arteries: Flow voids present bilaterally.  Paraspinal tissues: No prevertebral mass or effusion identified on inversion recovery images. Small fluid level is present in the infraglottic trachea, likely representing pooled secretions.  Disc  levels:  C2-C3: Disk desiccation. LEFT facet arthrosis. Central canal and foramina appear patent.  C3-C4:  ACDF.  No recurrent stenosis.  C4-C5:  ACDF.  No recurrent stenosis.  C5-C6: ACDF.  No recurrent stenosis.  C6-C7:  ACDF.  No recurrent stenosis.  C7-T1:  Disk desiccation.  Mild RIGHT facet arthrosis.  No stenosis.  IMPRESSION: Uncomplicated C3 through C7 ACDF with solid fusion. No cord compression or central canal compromise. Severe LEFT C2-C3 facet arthrosis.   Electronically Signed   By: Dereck Ligas M.D.   On: 09/24/2013 14:28     EKG Interpretation   Date/Time:  Thursday September 24 2013 12:33:59 EDT Ventricular Rate:  68 PR Interval:  178 QRS Duration: 86 QT Interval:  406 QTC Calculation: 432 R Axis:   17 Text Interpretation:  Sinus rhythm No significant change since last  tracing Confirmed by YAO  MD, DAVID ([id number]) on 09/24/2013 1:01:56 PM      MDM   Final diagnoses:  None   Xavier Campbell is a 58 y.o. male here with chocking sensation. Consider atypical ACS vs dysphagia vs cervical stenosis vs RPA. Will get xrays, likely CT neck. Will likely need MRI cervical spine as well. Had previous swallow study that showed dec peristasis and reflux. Protecting his airway currently.   5 PM  MRI showed no compression. Hydrated in the ED. Not drooling. Xray showed possible RPA. WBC nl. CT neck pending. Signed out to Dr. Vanita Panda. If CT unremarkable, can d/c home with GI f/u.     Wandra Arthurs, MD 09/24/13 [phone]

## 2013-09-24 NOTE — ED Notes (Signed)
Pt reports hx of throat surgery. States recently has had tickling sensation followed by chocking sensation. Pt states he feels this way even when not eating. Pt awake, alert, oriented x4, lung sounds diminished.

## 2013-09-24 NOTE — ED Notes (Signed)
Patient returned from MRI. Waiting for CT.

## 2013-09-24 NOTE — ED Notes (Signed)
Patient returned from X-ray 

## 2013-09-24 NOTE — ED Notes (Signed)
Patient returned from CT

## 2013-09-29 ENCOUNTER — Telehealth: Payer: Self-pay | Admitting: Gastroenterology

## 2013-09-29 NOTE — Telephone Encounter (Signed)
Spoke with the patient. He wants to schedule an appointment for after 10/15/13. He is agreeable to seeing a Librarian, academic. He reports he is able to eat and drink without choking. He feels as if he is choking but does not cough or vomit. I will call him once there is a schedule in the system. Patient given my name and number to call if he acutely worsens or does not here back from me by 10/14/13.

## 2013-10-09 ENCOUNTER — Telehealth: Payer: Self-pay

## 2013-10-09 NOTE — Telephone Encounter (Signed)
I have left message for the patient to call back about an appointment for 10/21/13.

## 2013-10-09 NOTE — Telephone Encounter (Signed)
Message copied by Greggory Keen on Fri Oct 09, 2013  9:10 AM ------      Message from: Virgina Evener A      Created: Tue Sep 29, 2013  2:03 PM       Call the patient and schedule him to see PA for his choking sensation. Needs an appointment after 10/15/13 per his request. Deatra Ina patient. EGD with [facility] 03/2011. He was seen in the ED fro this problem again on 09/24/13 ------

## 2013-10-12 NOTE — Telephone Encounter (Signed)
Patient calls back. Appointment scheduled for his convenience 10/21/13 at 2 pm.

## 2013-10-21 ENCOUNTER — Encounter: Payer: Self-pay | Admitting: Physician Assistant

## 2013-10-21 ENCOUNTER — Ambulatory Visit (INDEPENDENT_AMBULATORY_CARE_PROVIDER_SITE_OTHER): Payer: Medicaid Other | Admitting: Physician Assistant

## 2013-10-21 VITALS — BP 100/64 | HR 84 | Ht 71.5 in | Wt 162.5 lb

## 2013-10-21 DIAGNOSIS — K219 Gastro-esophageal reflux disease without esophagitis: Secondary | ICD-10-CM

## 2013-10-21 DIAGNOSIS — T17308A Unspecified foreign body in larynx causing other injury, initial encounter: Secondary | ICD-10-CM

## 2013-10-21 DIAGNOSIS — T18198A Other foreign object in esophagus causing other injury, initial encounter: Secondary | ICD-10-CM

## 2013-10-21 NOTE — Patient Instructions (Addendum)
You have been given a separate informational sheet regarding your tobacco use, the importance of quitting and local resources to help you quit. Continue the Omeprazole, 1 tab daily in the morning.  You have been scheduled for a Barium Esophogram at [facility] (1st floor of the hospital) on Monday 10-26-2013 at 11:30 am . Please arrive  At 11:15 am  for registration. Make certain not to have anything to eat or drink 3 hours prior to your test. If you need to reschedule for any reason, please contact radiology at [phone] to do so. __________________________________________________________________ A barium swallow is an examination that concentrates on views of the esophagus. This tends to be a double contrast exam (barium and two liquids which, when combined, create a gas to distend the wall of the oesophagus) or single contrast (non-ionic iodine based). The study is usually tailored to your symptoms so a good history is essential. Attention is paid during the study to the form, structure and configuration of the esophagus, looking for functional disorders (such as aspiration, dysphagia, achalasia, motility and reflux) EXAMINATION You may be asked to change into a gown, depending on the type of swallow being performed. A radiologist and radiographer will perform the procedure. The radiologist will advise you of the type of contrast selected for your procedure and direct you during the exam. You will be asked to stand, sit or lie in several different positions and to hold a small amount of fluid in your mouth before being asked to swallow while the imaging is performed .In some instances you may be asked to swallow barium coated marshmallows to assess the motility of a solid food bolus. The exam can be recorded as a digital or video fluoroscopy procedure. POST PROCEDURE It will take 1-2 days for the barium to pass through your system. To facilitate this, it is important, unless otherwise  directed, to increase your fluids for the next 24-48hrs and to resume your normal diet.  This test typically takes about 30 minutes to perform. __________________________________________________________________________________

## 2013-10-21 NOTE — Progress Notes (Signed)
Subjective:    Patient ID: Xavier Campbell, male    DOB: January 17, 1955, 58 y.o.   MRN: [id number]  HPI Xavier Campbell  is a 58 year old male known to Dr. Deatra Campbell. He has history of chronic GERD and had undergone prior endoscopies. This was last done in March of 2013 he which time he had a distal esophageal stricture which was [facility] dilated. Patient comes back in today with 2-3 week history of a "choking" sensation. He says he thinks he is choking on saliva. He says this is not occurring with drinking or with eating. He says it happens intermittently sporadically throughout the day he may be walking and develops a tickle sensation in his throat and then starts having choking and gagging. He denies any difficulty with liquid or solid food dysphagia. No current heartburn or indigestion. His appetite is fine his weight has been stable no abdominal discomfort. He is maintained on Prilosec 20 mg by mouth every morning. Patient is status post cervical fusion proximally 3 years ago and remotely had severe daily #8 and had severe neurogenic dysphasia associated. He says he hasn't had any problems with any residual deficits.    Review of Systems  Constitutional: Negative.   HENT: Negative.   Eyes: Negative.   Respiratory: Positive for choking.   Cardiovascular: Negative.   Gastrointestinal: Negative.   Endocrine: Negative.   Genitourinary: Negative.   Musculoskeletal: Negative.   Skin: Negative.   Allergic/Immunologic: Negative.   Neurological: Negative.   Psychiatric/Behavioral: Negative.    Outpatient Prescriptions Prior to Visit  Medication Sig Dispense Refill  . amitriptyline (ELAVIL) 100 MG tablet Take 100 mg by mouth at bedtime.      . brimonidine-timolol (COMBIGAN) 0.2-0.5 % ophthalmic solution Place 1 drop into the left eye every 12 (twelve) hours.      Marland Kitchen escitalopram (LEXAPRO) 20 MG tablet Take 20 mg by mouth daily.      Marland Kitchen HYDROcodone-acetaminophen (NORCO) 5-325 MG per tablet Take 1 tablet by  mouth every 6 (six) hours as needed. For pain      . lisinopril (PRINIVIL,ZESTRIL) 10 MG tablet Take 10 mg by mouth daily.      Marland Kitchen omeprazole (PRILOSEC) 20 MG capsule Take 20 mg by mouth daily.      . Travoprost, BAK Free, (TRAVATAN) 0.004 % SOLN ophthalmic solution Place 1 drop into both eyes at bedtime.      . gabapentin (NEURONTIN) 600 MG tablet Take 600 mg by mouth 3 (three) times daily.       No facility-administered medications prior to visit.    No Known Allergies Patient Active Problem List   Diagnosis Date Noted  . Arm numbness 04/15/2012  . Displacement of cervical intervertebral disc without myelopathy 03/14/2012  . Disturbance of skin sensation 03/14/2012  . Stricture and stenosis of esophagus 06/12/2011  . Sickle cell anemia   . Esophageal reflux 04/09/2011  . 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 HYPERTROPHY PROSTATE W/O UR OBST & OTH LUTS 10/25/2008  . OTHER HEMOGLOBINOPATHIES 10/15/2008  . LIPOMA 09/24/2008  . ABSCESS, TOOTH 09/24/2008  . OSTEOARTHRITIS, CERVICAL SPINE 09/24/2008  . CHEST PAIN UNSPECIFIED 09/24/2008  . ORBITAL FLOOR , CLOSED FRACTURE 09/24/2008  . 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 APPENDECTOMY, HX OF 09/24/2008   History  Substance Use Topics  . Smoking status: Current Every Day Smoker -- 0.25 packs/day for 40 years  . Smokeless tobacco: Never Used     Comment: Counseling sheet given 03-2011  . Alcohol Use: No  family history includes Diabetes in his mother; Heart disease in his father and mother. There is no history of Colon cancer.     Objective:   Physical Exam    well-developed thin African American male in no acute distress blood pressure 100/64 pulse 84 height 511 weight 162. HEENT; nontraumatic normocephalic EOMI PERRLA sclera anicteric, Supple; no JVD no palpable adenopathy, Cardiovascular; regular rate and rhythm with S1-S2 no murmur or gallop, Pulmonary; clear bilaterally, Abdomen ;flat soft nontender nondistended bowel sounds are  active no palpable mass or hepatosplenomegaly, Extremities; no clubbing cyanosis or edema skin warm dry, Psych ;mood and affect normal and appropriate        Assessment & Plan:   #53   58 year old male with 3-4 week history of intermittent "choking" with coughing and gagging. This has not been related to eating and he specifically denies any solid or liquid dysphagia. His current symptoms are not consistent with recurrent esophageal stricture. Will rule out a neurogenic dysphasia  #2 prior history of EtOH and drug abuse  #3 status post cervical fusion  #4 history of chronic GERD and distal esophageal stricture requiring dilation 2013   Plan ; patient will continue Prilosec 20 mg by mouth every morning  Will obtain barium swallow initially and then depending on results decide if he needs referral to speech path for a modified swallowing a bowel or ENT evaluation.

## 2013-10-22 NOTE — Progress Notes (Signed)
Reviewed and agree with management. Robert D. Kaplan, M.D., FACG  

## 2013-10-26 ENCOUNTER — Ambulatory Visit (HOSPITAL_COMMUNITY)
Admission: RE | Admit: 2013-10-26 | Discharge: 2013-10-26 | Disposition: A | Discharge status: Home / Self Care | Payer: Medicaid Other | Source: Ambulatory Visit | Attending: Physician Assistant | Admitting: Physician Assistant

## 2013-10-26 DIAGNOSIS — K219 Gastro-esophageal reflux disease without esophagitis: Secondary | ICD-10-CM

## 2013-10-26 DIAGNOSIS — K222 Esophageal obstruction: Secondary | ICD-10-CM | POA: Diagnosis not present

## 2013-10-26 DIAGNOSIS — T17308A Unspecified foreign body in larynx causing other injury, initial encounter: Secondary | ICD-10-CM

## 2013-10-26 DIAGNOSIS — R0989 Other specified symptoms and signs involving the circulatory and respiratory systems: Secondary | ICD-10-CM | POA: Insufficient documentation

## 2013-10-27 ENCOUNTER — Other Ambulatory Visit: Payer: Self-pay | Admitting: *Deleted

## 2013-10-27 DIAGNOSIS — R933 Abnormal findings on diagnostic imaging of other parts of digestive tract: Secondary | ICD-10-CM

## 2015-11-21 ENCOUNTER — Other Ambulatory Visit: Payer: Self-pay | Admitting: Orthopedic Surgery

## 2015-11-21 DIAGNOSIS — G8929 Other chronic pain: Secondary | ICD-10-CM

## 2015-11-21 DIAGNOSIS — M5441 Lumbago with sciatica, right side: Principal | ICD-10-CM

## 2015-11-30 ENCOUNTER — Other Ambulatory Visit: Payer: Self-pay | Admitting: Orthopedic Surgery

## 2015-11-30 ENCOUNTER — Ambulatory Visit
Admission: RE | Admit: 2015-11-30 | Discharge: 2015-11-30 | Disposition: A | Discharge status: Home / Self Care | Payer: Medicaid Other | Source: Ambulatory Visit | Attending: Orthopedic Surgery | Admitting: Orthopedic Surgery

## 2015-11-30 ENCOUNTER — Ambulatory Visit
Admission: RE | Admit: 2015-11-30 | Discharge: 2015-11-30 | Disposition: A | Discharge status: Home / Self Care | Payer: Self-pay | Source: Ambulatory Visit | Attending: Orthopedic Surgery | Admitting: Orthopedic Surgery

## 2015-11-30 DIAGNOSIS — G8929 Other chronic pain: Secondary | ICD-10-CM

## 2015-11-30 DIAGNOSIS — M5441 Lumbago with sciatica, right side: Principal | ICD-10-CM

## 2015-11-30 MED ORDER — IOPAMIDOL (ISOVUE-M 200) INJECTION 41%
15.0000 mL | Freq: Once | INTRAMUSCULAR | Status: AC
  Administered 2015-11-30: 15 mL via INTRATHECAL

## 2015-11-30 MED ORDER — ONDANSETRON HCL 4 MG/2ML IJ SOLN: 4.0000 mg | Freq: Four times a day (QID) | INTRAMUSCULAR | Status: DC | PRN

## 2015-11-30 MED ORDER — DIAZEPAM 5 MG PO TABS
10.0000 mg | ORAL_TABLET | Freq: Once | ORAL | Status: AC
  Administered 2015-11-30: 10 mg via ORAL

## 2015-11-30 MED ORDER — ONDANSETRON HCL 4 MG/2ML IJ SOLN
4.0000 mg | Freq: Once | INTRAMUSCULAR | Status: AC
  Administered 2015-11-30: 4 mg via INTRAMUSCULAR

## 2015-11-30 MED ORDER — MEPERIDINE HCL 100 MG/ML IJ SOLN
75.0000 mg | Freq: Once | INTRAMUSCULAR | Status: AC
  Administered 2015-11-30: 75 mg via INTRAMUSCULAR

## 2015-11-30 NOTE — Progress Notes (Signed)
Pt states he has not taken Lexapro, Elavil and Amitriptyline.

## 2015-11-30 NOTE — Discharge Instructions (Signed)
Myelogram Discharge Instructions  1. Go home and rest quietly for the next 24 hours.  It is important to lie flat for the next 24 hours.  Get up only to go to the restroom.  You may lie in the bed or on a couch on your back, your stomach, your left side or your right side.  You may have one pillow under your head.  You may have pillows between your knees while you are on your side or under your knees while you are on your back.  2. DO NOT drive today.  Recline the seat as far back as it will go, while still wearing your seat belt, on the way home.  3. You may get up to go to the bathroom as needed.  You may sit up for 10 minutes to eat.  You may resume your normal diet and medications unless otherwise indicated.  Drink lots of extra fluids today and tomorrow.  4. The incidence of headache, nausea, or vomiting is about 5% (one in 20 patients).  If you develop a headache, lie flat and drink plenty of fluids until the headache goes away.  Caffeinated beverages may be helpful.  If you develop severe nausea and vomiting or a headache that does not go away with flat bed rest, call [phone].  5. You may resume normal activities after your 24 hours of bed rest is over; however, do not exert yourself strongly or do any heavy lifting tomorrow. If when you get up you have a headache when standing, go back to bed and force fluids for another 24 hours.  6. Call your physician for a follow-up appointment.  The results of your myelogram will be sent directly to your physician by the following day.  7. If you have any questions or if complications develop after you arrive home, please call [phone].  Discharge instructions have been explained to the patient.  The patient, or the person responsible for the patient, fully understands these instructions.         May resume Amitriptyline, Lexapro and Elavil on Nov. 16 after 9:30 am.

## 2015-12-02 ENCOUNTER — Telehealth: Payer: Self-pay

## 2015-12-16 HISTORY — PX: CATARACT EXTRACTION W/ INTRAOCULAR LENS  IMPLANT, BILATERAL: SHX1307

## 2016-01-17 ENCOUNTER — Encounter (HOSPITAL_COMMUNITY): Payer: Self-pay | Admitting: *Deleted

## 2016-01-17 NOTE — Patient Instructions (Signed)
Xavier Campbell  01/17/2016   Your procedure is scheduled on: 01/25/16  Report to [facility] Main  Entrance take [place]  elevators to 3rd floor to  [place] at    [place] AM.  Call this number if you have problems the morning of surgery [phone]   Remember: ONLY 1 PERSON MAY GO WITH YOU TO SHORT STAY TO GET  READY MORNING OF [place].  Do not eat food or drink liquids :After Midnight.     Take these medicines the morning of surgery with A SIP OF WATER: eye drops as usual , lexapro, hydrocodone if needed, prilosec                                You may not have any metal on your body including hair pins and              piercings  Do not wear jewelry,  lotions, powders or perfumes, deodorant                          Men may shave face and neck.   Do not bring valuables to the hospital. [place].  Contacts, dentures or bridgework may not be worn into surgery.  Leave suitcase in the car. After surgery it may be brought to your room.                   Please read over the following fact sheets you were given: _____________________________________________________________________             [facility] - Preparing for Surgery Before surgery, you can play an important role.  Because skin is not sterile, your skin needs to be as free of germs as possible.  You can reduce the number of germs on your skin by washing with CHG (chlorahexidine gluconate) soap before surgery.  CHG is an antiseptic cleaner which kills germs and bonds with the skin to continue killing germs even after washing. Please DO NOT use if you have an allergy to CHG or antibacterial soaps.  If your skin becomes reddened/irritated stop using the CHG and inform your nurse when you arrive at Short Stay. Do not shave (including legs and underarms) for at least 48 hours prior to the first CHG shower.  You may shave your face/neck. Please  follow these instructions carefully:  1.  Shower with CHG Soap the night before surgery and the  morning of Surgery.  2.  If you choose to wash your hair, wash your hair first as usual with your  normal  shampoo.  3.  After you shampoo, rinse your hair and body thoroughly to remove the  shampoo.                           4.  Use CHG as you would any other liquid soap.  You can apply chg directly  to the skin and wash                       Gently with a scrungie or clean washcloth.  5.  Apply the CHG Soap to your  body ONLY FROM THE NECK DOWN.   Do not use on face/ open                           Wound or open sores. Avoid contact with eyes, ears mouth and genitals (private parts).                       Wash face,  Genitals (private parts) with your normal soap.             6.  Wash thoroughly, paying special attention to the area where your surgery  will be performed.  7.  Thoroughly rinse your body with warm water from the neck down.  8.  DO NOT shower/wash with your normal soap after using and rinsing off  the CHG Soap.                9.  Pat yourself dry with a clean towel.            10.  Wear clean pajamas.            11.  Place clean sheets on your bed the night of your first shower and do not  sleep with pets. Day of Surgery : Do not apply any lotions/deodorants the morning of surgery.  Please wear clean clothes to the hospital/surgery center.  FAILURE TO FOLLOW THESE INSTRUCTIONS MAY RESULT IN THE CANCELLATION OF YOUR SURGERY PATIENT SIGNATURE_________________________________  NURSE SIGNATURE__________________________________  ________________________________________________________________________  WHAT IS A BLOOD TRANSFUSION? Blood Transfusion Information  A transfusion is the replacement of blood or some of its parts. Blood is made up of multiple cells which provide different functions.  Red blood cells carry oxygen and are used for blood loss replacement.  White blood cells  fight against infection.  Platelets control bleeding.  Plasma helps clot blood.  Other blood products are available for specialized needs, such as hemophilia or other clotting disorders. BEFORE THE TRANSFUSION  Who gives blood for transfusions?   Healthy volunteers who are fully evaluated to make sure their blood is safe. This is blood bank blood. Transfusion therapy is the safest it has ever been in the practice of medicine. Before blood is taken from a donor, a complete history is taken to make sure that person has no history of diseases nor engages in risky social behavior (examples are intravenous drug use or sexual activity with multiple partners). The donor's travel history is screened to minimize risk of transmitting infections, such as malaria. The donated blood is tested for signs of infectious diseases, such as HIV and hepatitis. The blood is then tested to be sure it is compatible with you in order to minimize the chance of a transfusion reaction. If you or a relative donates blood, this is often done in anticipation of surgery and is not appropriate for emergency situations. It takes many days to process the donated blood. RISKS AND COMPLICATIONS Although transfusion therapy is very safe and saves many lives, the main dangers of transfusion include:   Getting an infectious disease.  Developing a transfusion reaction. This is an allergic reaction to something in the blood you were given. Every precaution is taken to prevent this. The decision to have a blood transfusion has been considered carefully by your caregiver before blood is given. Blood is not given unless the benefits outweigh the risks. AFTER THE TRANSFUSION  Right after receiving a blood transfusion, you will usually feel  much better and more energetic. This is especially true if your red blood cells have gotten low (anemic). The transfusion raises the level of the red blood cells which carry oxygen, and this usually  causes an energy increase.  The nurse administering the transfusion will monitor you carefully for complications. HOME CARE INSTRUCTIONS  No special instructions are needed after a transfusion. You may find your energy is better. Speak with your caregiver about any limitations on activity for underlying diseases you may have. SEEK MEDICAL CARE IF:   Your condition is not improving after your transfusion.  You develop redness or irritation at the intravenous (IV) site. SEEK IMMEDIATE MEDICAL CARE IF:  Any of the following symptoms occur over the next 12 hours:  Shaking chills.  You have a temperature by mouth above 102 F (38.9 C), not controlled by medicine.  Chest, back, or muscle pain.  People around you feel you are not acting correctly or are confused.  Shortness of breath or difficulty breathing.  Dizziness and fainting.  You get a rash or develop hives.  You have a decrease in urine output.  Your urine turns a dark color or changes to pink, red, or brown. Any of the following symptoms occur over the next 10 days:  You have a temperature by mouth above 102 F (38.9 C), not controlled by medicine.  Shortness of breath.  Weakness after normal activity.  The white part of the eye turns yellow (jaundice).  You have a decrease in the amount of urine or are urinating less often.  Your urine turns a dark color or changes to pink, red, or brown. Document Released: 12/30/1999 Document Revised: 03/26/2011 Document Reviewed: 08/18/2007 ExitCare Patient Information 2014 [place].  _______________________________________________________________________  Incentive Spirometer  An incentive spirometer is a tool that can help keep your lungs clear and active. This tool measures how well you are filling your lungs with each breath. Taking long deep breaths may help reverse or decrease the chance of developing breathing (pulmonary) problems (especially infection)  following:  A long period of time when you are unable to move or be active. BEFORE THE PROCEDURE   If the spirometer includes an indicator to show your best effort, your nurse or respiratory therapist will set it to a desired goal.  If possible, sit up straight or lean slightly forward. Try not to slouch.  Hold the incentive spirometer in an upright position. INSTRUCTIONS FOR USE  1. Sit on the edge of your bed if possible, or sit up as far as you can in bed or on a chair. 2. Hold the incentive spirometer in an upright position. 3. Breathe out normally. 4. Place the mouthpiece in your mouth and seal your lips tightly around it. 5. Breathe in slowly and as deeply as possible, raising the piston or the ball toward the top of the column. 6. Hold your breath for 3-5 seconds or for as long as possible. Allow the piston or ball to fall to the bottom of the column. 7. Remove the mouthpiece from your mouth and breathe out normally. 8. Rest for a few seconds and repeat Steps 1 through 7 at least 10 times every 1-2 hours when you are awake. Take your time and take a few normal breaths between deep breaths. 9. The spirometer may include an indicator to show your best effort. Use the indicator as a goal to work toward during each repetition. 10. After each set of 10 deep breaths, practice coughing to be  sure your lungs are clear. If you have an incision (the cut made at the time of surgery), support your incision when coughing by placing a pillow or rolled up towels firmly against it. Once you are able to get out of bed, walk around indoors and cough well. You may stop using the incentive spirometer when instructed by your caregiver.  RISKS AND COMPLICATIONS  Take your time so you do not get dizzy or light-headed.  If you are in pain, you may need to take or ask for pain medication before doing incentive spirometry. It is harder to take a deep breath if you are having pain. AFTER USE  Rest and  breathe slowly and easily.  It can be helpful to keep track of a log of your progress. Your caregiver can provide you with a simple table to help with this. If you are using the spirometer at home, follow these instructions: [place] IF:   You are having difficultly using the spirometer.  You have trouble using the spirometer as often as instructed.  Your pain medication is not giving enough relief while using the spirometer.  You develop fever of 100.5 F (38.1 C) or higher. SEEK IMMEDIATE MEDICAL CARE IF:   You cough up bloody sputum that had not been present before.  You develop fever of 102 F (38.9 C) or greater.  You develop worsening pain at or near the incision site. MAKE SURE YOU:   Understand these instructions.  Will watch your condition.  Will get help right away if you are not doing well or get worse. Document Released: 05/14/2006 Document Revised: 03/26/2011 Document Reviewed: 07/15/2006 [facility] Patient Information 2014 [place], [place].   ________________________________________________________________________

## 2016-01-18 ENCOUNTER — Encounter (HOSPITAL_COMMUNITY): Payer: Self-pay

## 2016-01-18 ENCOUNTER — Encounter (HOSPITAL_COMMUNITY)
Admission: RE | Admit: 2016-01-18 | Discharge: 2016-01-18 | Disposition: A | Discharge status: Home / Self Care | Payer: Medicaid Other | Source: Ambulatory Visit | Attending: Orthopedic Surgery | Admitting: Orthopedic Surgery

## 2016-01-18 ENCOUNTER — Ambulatory Visit (HOSPITAL_COMMUNITY)
Admission: RE | Admit: 2016-01-18 | Discharge: 2016-01-18 | Disposition: A | Discharge status: Home / Self Care | Payer: Medicaid Other | Source: Ambulatory Visit | Attending: Surgical | Admitting: Surgical

## 2016-01-18 ENCOUNTER — Encounter (HOSPITAL_COMMUNITY): Payer: Self-pay | Admitting: *Deleted

## 2016-01-18 DIAGNOSIS — M48061 Spinal stenosis, lumbar region without neurogenic claudication: Secondary | ICD-10-CM | POA: Diagnosis not present

## 2016-01-18 DIAGNOSIS — M4316 Spondylolisthesis, lumbar region: Secondary | ICD-10-CM | POA: Insufficient documentation

## 2016-01-18 DIAGNOSIS — M545 Low back pain, unspecified: Secondary | ICD-10-CM

## 2016-01-18 DIAGNOSIS — Z01812 Encounter for preprocedural laboratory examination: Secondary | ICD-10-CM | POA: Insufficient documentation

## 2016-01-18 DIAGNOSIS — Z01818 Encounter for other preprocedural examination: Secondary | ICD-10-CM | POA: Diagnosis not present

## 2016-01-18 DIAGNOSIS — M5137 Other intervertebral disc degeneration, lumbosacral region: Secondary | ICD-10-CM | POA: Diagnosis not present

## 2016-01-18 DIAGNOSIS — Z0183 Encounter for blood typing: Secondary | ICD-10-CM | POA: Diagnosis not present

## 2016-01-18 LAB — COMPREHENSIVE METABOLIC PANEL
ALT: 23 U/L (ref 17–63)
AST: 22 U/L (ref 15–41)
Albumin: 4.6 g/dL (ref 3.5–5.0)
Alkaline Phosphatase: 71 U/L (ref 38–126)
Anion gap: 9 (ref 5–15)
BUN: 12 mg/dL (ref 6–20)
CO2: 26 mmol/L (ref 22–32)
Calcium: 9.5 mg/dL (ref 8.9–10.3)
Chloride: 103 mmol/L (ref 101–111)
Creatinine, Ser: 0.86 mg/dL (ref 0.61–1.24)
GFR calc Af Amer: >60 mL/min (ref >60)
GFR calc non Af Amer: >60 mL/min (ref >60)
Glucose, Bld: 94 mg/dL (ref 65–99)
Potassium: 4.2 mmol/L (ref 3.5–5.1)
Sodium: 138 mmol/L (ref 135–145)
Total Bilirubin: 0.9 mg/dL (ref 0.3–1.2)
Total Protein: 7.6 g/dL (ref 6.5–8.1)

## 2016-01-18 LAB — SURGICAL PCR SCREEN
MRSA, PCR: NEGATIVE
Staphylococcus aureus: NEGATIVE

## 2016-01-18 LAB — CBC WITH DIFFERENTIAL/PLATELET
Basophils Absolute: 0 10*3/uL (ref 0.0–0.1)
Basophils Relative: 0 %
Eosinophils Absolute: 0.2 10*3/uL (ref 0.0–0.7)
Eosinophils Relative: 2 %
HCT: 31.5 % — ABNORMAL LOW (ref 39.0–52.0)
Hemoglobin: 11 g/dL — ABNORMAL LOW (ref 13.0–17.0)
Lymphocytes Relative: 35 %
Lymphs Abs: 2.9 10*3/uL (ref 0.7–4.0)
MCH: 20.8 pg — ABNORMAL LOW (ref 26.0–34.0)
MCHC: 34.9 g/dL (ref 30.0–36.0)
MCV: 59.5 fL — ABNORMAL LOW (ref 78.0–100.0)
Monocytes Absolute: 0.6 10*3/uL (ref 0.1–1.0)
Monocytes Relative: 7 %
Neutro Abs: 4.5 10*3/uL (ref 1.7–7.7)
Neutrophils Relative %: 56 %
Platelets: 200 10*3/uL (ref 150–400)
RBC: 5.29 MIL/uL (ref 4.22–5.81)
RDW: 18.1 % — ABNORMAL HIGH (ref 11.5–15.5)
WBC: 8.2 10*3/uL (ref 4.0–10.5)
nRBC: 2 /100 WBC — ABNORMAL HIGH

## 2016-01-18 LAB — APTT: aPTT: 32 seconds (ref 24–36)

## 2016-01-18 LAB — PROTIME-INR
INR: 1.08
Prothrombin Time: 14 seconds (ref 11.4–15.2)

## 2016-01-18 LAB — ABO/RH: ABO/RH(D): O POS

## 2016-01-18 NOTE — Progress Notes (Signed)
Final EKG in Epic as of 01/18/2016.

## 2016-01-25 ENCOUNTER — Ambulatory Visit (HOSPITAL_COMMUNITY): Payer: Medicaid Other

## 2016-01-25 ENCOUNTER — Encounter (HOSPITAL_COMMUNITY)
Admission: RE | Disposition: A | Discharge status: Home / Self Care | Payer: Self-pay | Source: Ambulatory Visit | Attending: Orthopedic Surgery

## 2016-01-25 ENCOUNTER — Encounter (HOSPITAL_COMMUNITY): Payer: Self-pay | Admitting: *Deleted

## 2016-01-25 ENCOUNTER — Ambulatory Visit (HOSPITAL_COMMUNITY): Payer: Medicaid Other | Admitting: Registered Nurse

## 2016-01-25 ENCOUNTER — Observation Stay (HOSPITAL_COMMUNITY)
Admission: RE | Admit: 2016-01-25 | Discharge: 2016-01-26 | Disposition: A | Discharge status: Home / Self Care | Payer: Medicaid Other | Source: Ambulatory Visit | Attending: Orthopedic Surgery | Admitting: Orthopedic Surgery

## 2016-01-25 DIAGNOSIS — K219 Gastro-esophageal reflux disease without esophagitis: Secondary | ICD-10-CM | POA: Insufficient documentation

## 2016-01-25 DIAGNOSIS — I10 Essential (primary) hypertension: Secondary | ICD-10-CM | POA: Diagnosis not present

## 2016-01-25 DIAGNOSIS — M4316 Spondylolisthesis, lumbar region: Secondary | ICD-10-CM | POA: Insufficient documentation

## 2016-01-25 DIAGNOSIS — M48062 Spinal stenosis, lumbar region with neurogenic claudication: Secondary | ICD-10-CM | POA: Diagnosis not present

## 2016-01-25 DIAGNOSIS — F17210 Nicotine dependence, cigarettes, uncomplicated: Secondary | ICD-10-CM | POA: Diagnosis not present

## 2016-01-25 DIAGNOSIS — N400 Enlarged prostate without lower urinary tract symptoms: Secondary | ICD-10-CM | POA: Insufficient documentation

## 2016-01-25 DIAGNOSIS — D573 Sickle-cell trait: Secondary | ICD-10-CM | POA: Insufficient documentation

## 2016-01-25 DIAGNOSIS — M1990 Unspecified osteoarthritis, unspecified site: Secondary | ICD-10-CM | POA: Insufficient documentation

## 2016-01-25 DIAGNOSIS — Z419 Encounter for procedure for purposes other than remedying health state, unspecified: Secondary | ICD-10-CM

## 2016-01-25 DIAGNOSIS — M5137 Other intervertebral disc degeneration, lumbosacral region: Secondary | ICD-10-CM | POA: Diagnosis not present

## 2016-01-25 DIAGNOSIS — M5136 Other intervertebral disc degeneration, lumbar region: Secondary | ICD-10-CM | POA: Diagnosis not present

## 2016-01-25 DIAGNOSIS — H409 Unspecified glaucoma: Secondary | ICD-10-CM | POA: Insufficient documentation

## 2016-01-25 HISTORY — PX: LUMBAR LAMINECTOMY/DECOMPRESSION MICRODISCECTOMY: SHX5026

## 2016-01-25 LAB — TYPE AND SCREEN
ABO/RH(D): O POS
Antibody Screen: NEGATIVE

## 2016-01-25 SURGERY — LUMBAR LAMINECTOMY/DECOMPRESSION MICRODISCECTOMY 2 LEVELS
Anesthesia: General | Site: Back | Laterality: Bilateral

## 2016-01-25 MED ORDER — METHOCARBAMOL 500 MG PO TABS
500.0000 mg | ORAL_TABLET | Freq: Four times a day (QID) | ORAL | Status: DC | PRN
  Administered 2016-01-25 – 2016-01-26 (×2): 500 mg via ORAL
  Filled 2016-01-25 (×2): qty 1

## 2016-01-25 MED ORDER — CHLORHEXIDINE GLUCONATE 4 % EX LIQD: 60.0000 mL | Freq: Once | CUTANEOUS | Status: DC

## 2016-01-25 MED ORDER — SUGAMMADEX SODIUM 200 MG/2ML IV SOLN
INTRAVENOUS | Status: DC | PRN
  Administered 2016-01-25: 200 mg via INTRAVENOUS

## 2016-01-25 MED ORDER — ONDANSETRON HCL 4 MG/2ML IJ SOLN: 4.0000 mg | INTRAMUSCULAR | Status: DC | PRN

## 2016-01-25 MED ORDER — CEFAZOLIN SODIUM-DEXTROSE 2-4 GM/100ML-% IV SOLN
2.0000 g | INTRAVENOUS | Status: AC
  Administered 2016-01-25: 2 g via INTRAVENOUS
  Filled 2016-01-25: qty 100

## 2016-01-25 MED ORDER — LACTATED RINGERS IV SOLN
INTRAVENOUS | Status: DC
  Administered 2016-01-25: 100 mL/h via INTRAVENOUS

## 2016-01-25 MED ORDER — HYDROMORPHONE HCL 1 MG/ML IJ SOLN
0.2500 mg | INTRAMUSCULAR | Status: DC | PRN
  Administered 2016-01-25 (×4): 0.5 mg via INTRAVENOUS
  Administered 2016-01-25: 0.25 mg via INTRAVENOUS

## 2016-01-25 MED ORDER — ESCITALOPRAM OXALATE 20 MG PO TABS
20.0000 mg | ORAL_TABLET | Freq: Every day | ORAL | Status: DC
Start: 2016-01-25 — End: 2016-01-26
  Administered 2016-01-25 – 2016-01-26 (×2): 20 mg via ORAL
  Filled 2016-01-25 (×2): qty 1

## 2016-01-25 MED ORDER — DEXAMETHASONE SODIUM PHOSPHATE 10 MG/ML IJ SOLN
INTRAMUSCULAR | Status: DC | PRN
  Administered 2016-01-25: 10 mg via INTRAVENOUS

## 2016-01-25 MED ORDER — BUPIVACAINE LIPOSOME 1.3 % IJ SUSP
INTRAMUSCULAR | Status: DC | PRN
  Administered 2016-01-25: 20 mL

## 2016-01-25 MED ORDER — MENTHOL 3 MG MT LOZG: 1.0000 | LOZENGE | OROMUCOSAL | Status: DC | PRN

## 2016-01-25 MED ORDER — OXYCODONE HCL 5 MG/5ML PO SOLN
5.0000 mg | Freq: Once | ORAL | Status: DC | PRN
  Filled 2016-01-25: qty 5

## 2016-01-25 MED ORDER — PHENYLEPHRINE 40 MCG/ML (10ML) SYRINGE FOR IV PUSH (FOR BLOOD PRESSURE SUPPORT)
PREFILLED_SYRINGE | INTRAVENOUS | Status: AC
  Filled 2016-01-25: qty 10

## 2016-01-25 MED ORDER — PHENYLEPHRINE 40 MCG/ML (10ML) SYRINGE FOR IV PUSH (FOR BLOOD PRESSURE SUPPORT)
PREFILLED_SYRINGE | INTRAVENOUS | Status: DC | PRN
  Administered 2016-01-25: 80 ug via INTRAVENOUS

## 2016-01-25 MED ORDER — BISACODYL 5 MG PO TBEC: 5.0000 mg | DELAYED_RELEASE_TABLET | Freq: Every day | ORAL | Status: DC | PRN

## 2016-01-25 MED ORDER — FLEET ENEMA 7-19 GM/118ML RE ENEM: 1.0000 | ENEMA | Freq: Once | RECTAL | Status: DC | PRN

## 2016-01-25 MED ORDER — OXYCODONE-ACETAMINOPHEN 5-325 MG PO TABS: 1.0000 | ORAL_TABLET | ORAL | 0 refills | Status: DC | PRN

## 2016-01-25 MED ORDER — LIDOCAINE 2% (20 MG/ML) 5 ML SYRINGE
INTRAMUSCULAR | Status: DC | PRN
  Administered 2016-01-25: 100 mg via INTRAVENOUS

## 2016-01-25 MED ORDER — METHOCARBAMOL 1000 MG/10ML IJ SOLN
500.0000 mg | Freq: Four times a day (QID) | INTRAVENOUS | Status: DC | PRN
  Administered 2016-01-25: 500 mg via INTRAVENOUS
  Filled 2016-01-25: qty 5
  Filled 2016-01-25: qty 550

## 2016-01-25 MED ORDER — OXYCODONE-ACETAMINOPHEN 5-325 MG PO TABS
1.0000 | ORAL_TABLET | ORAL | Status: DC | PRN
  Administered 2016-01-25: 21:00:00 2 via ORAL
  Administered 2016-01-25 (×2): 1 via ORAL
  Administered 2016-01-26 (×3): 2 via ORAL
  Filled 2016-01-25: qty 1
  Filled 2016-01-25 (×4): qty 2
  Filled 2016-01-25: qty 1

## 2016-01-25 MED ORDER — LISINOPRIL 10 MG PO TABS
10.0000 mg | ORAL_TABLET | Freq: Every day | ORAL | Status: DC
  Administered 2016-01-26: 08:00:00 10 mg via ORAL
  Filled 2016-01-25: qty 1

## 2016-01-25 MED ORDER — CEFAZOLIN SODIUM-DEXTROSE 2-4 GM/100ML-% IV SOLN
INTRAVENOUS | Status: AC
  Filled 2016-01-25: qty 100

## 2016-01-25 MED ORDER — ROCURONIUM BROMIDE 50 MG/5ML IV SOSY
PREFILLED_SYRINGE | INTRAVENOUS | Status: AC
  Filled 2016-01-25: qty 5

## 2016-01-25 MED ORDER — HYDROMORPHONE HCL 2 MG/ML IJ SOLN
INTRAMUSCULAR | Status: AC
  Filled 2016-01-25: qty 1

## 2016-01-25 MED ORDER — BRIMONIDINE TARTRATE 0.2 % OP SOLN
1.0000 [drp] | Freq: Two times a day (BID) | OPHTHALMIC | Status: DC
  Administered 2016-01-26: 08:00:00 1 [drp] via OPHTHALMIC
  Filled 2016-01-25: qty 5

## 2016-01-25 MED ORDER — THROMBIN 5000 UNITS EX SOLR
OROMUCOSAL | Status: DC | PRN
  Administered 2016-01-25: 10 mL via TOPICAL

## 2016-01-25 MED ORDER — LIDOCAINE-EPINEPHRINE (PF) 1 %-1:200000 IJ SOLN
INTRAMUSCULAR | Status: AC
  Filled 2016-01-25: qty 30

## 2016-01-25 MED ORDER — PROPOFOL 10 MG/ML IV BOLUS
INTRAVENOUS | Status: AC
  Filled 2016-01-25: qty 40

## 2016-01-25 MED ORDER — BACITRACIN-NEOMYCIN-POLYMYXIN 400-5-5000 EX OINT
TOPICAL_OINTMENT | CUTANEOUS | Status: AC
  Filled 2016-01-25: qty 1

## 2016-01-25 MED ORDER — SODIUM CHLORIDE 0.9 % IR SOLN
Status: AC
  Filled 2016-01-25: qty 500000

## 2016-01-25 MED ORDER — BRIMONIDINE TARTRATE-TIMOLOL 0.2-0.5 % OP SOLN: 1.0000 [drp] | Freq: Two times a day (BID) | OPHTHALMIC | Status: DC

## 2016-01-25 MED ORDER — CEFAZOLIN IN D5W 1 GM/50ML IV SOLN
1.0000 g | Freq: Three times a day (TID) | INTRAVENOUS | Status: AC
  Administered 2016-01-25 – 2016-01-26 (×3): 1 g via INTRAVENOUS
  Filled 2016-01-25 (×3): qty 50

## 2016-01-25 MED ORDER — ONDANSETRON HCL 4 MG/2ML IJ SOLN
INTRAMUSCULAR | Status: DC | PRN
  Administered 2016-01-25: 4 mg via INTRAVENOUS

## 2016-01-25 MED ORDER — ALUM & MAG HYDROXIDE-SIMETH 200-200-20 MG/5ML PO SUSP
30.0000 mL | ORAL | Status: DC | PRN
  Administered 2016-01-25: 30 mL via ORAL
  Filled 2016-01-25: qty 30

## 2016-01-25 MED ORDER — PROMETHAZINE HCL 25 MG/ML IJ SOLN: 6.2500 mg | INTRAMUSCULAR | Status: DC | PRN

## 2016-01-25 MED ORDER — THROMBIN 5000 UNITS EX SOLR
CUTANEOUS | Status: AC
  Filled 2016-01-25: qty 10000

## 2016-01-25 MED ORDER — TIMOLOL MALEATE 0.5 % OP SOLN
1.0000 [drp] | Freq: Two times a day (BID) | OPHTHALMIC | Status: DC
  Filled 2016-01-25: qty 5

## 2016-01-25 MED ORDER — HYDROCODONE-ACETAMINOPHEN 5-325 MG PO TABS: 1.0000 | ORAL_TABLET | ORAL | Status: DC | PRN

## 2016-01-25 MED ORDER — MIDAZOLAM HCL 5 MG/5ML IJ SOLN
INTRAMUSCULAR | Status: DC | PRN
  Administered 2016-01-25: 2 mg via INTRAVENOUS

## 2016-01-25 MED ORDER — MIDAZOLAM HCL 2 MG/2ML IJ SOLN
INTRAMUSCULAR | Status: AC
  Filled 2016-01-25: qty 2

## 2016-01-25 MED ORDER — PANTOPRAZOLE SODIUM 40 MG PO TBEC
40.0000 mg | DELAYED_RELEASE_TABLET | Freq: Every day | ORAL | Status: DC
Start: 2016-01-25 — End: 2016-01-26
  Administered 2016-01-25 – 2016-01-26 (×2): 40 mg via ORAL
  Filled 2016-01-25 (×2): qty 1

## 2016-01-25 MED ORDER — DEXAMETHASONE SODIUM PHOSPHATE 10 MG/ML IJ SOLN
INTRAMUSCULAR | Status: AC
  Filled 2016-01-25: qty 1

## 2016-01-25 MED ORDER — SUGAMMADEX SODIUM 200 MG/2ML IV SOLN
INTRAVENOUS | Status: AC
  Filled 2016-01-25: qty 2

## 2016-01-25 MED ORDER — ONDANSETRON HCL 4 MG/2ML IJ SOLN
INTRAMUSCULAR | Status: AC
  Filled 2016-01-25: qty 2

## 2016-01-25 MED ORDER — ROCURONIUM BROMIDE 10 MG/ML (PF) SYRINGE
PREFILLED_SYRINGE | INTRAVENOUS | Status: DC | PRN
  Administered 2016-01-25: 20 mg via INTRAVENOUS
  Administered 2016-01-25: 50 mg via INTRAVENOUS

## 2016-01-25 MED ORDER — PHENOL 1.4 % MT LIQD: 1.0000 | OROMUCOSAL | Status: DC | PRN

## 2016-01-25 MED ORDER — HYDROMORPHONE HCL 1 MG/ML IJ SOLN: 0.5000 mg | INTRAMUSCULAR | Status: DC | PRN

## 2016-01-25 MED ORDER — BUPIVACAINE HCL (PF) 0.5 % IJ SOLN
INTRAMUSCULAR | Status: AC
  Filled 2016-01-25: qty 30

## 2016-01-25 MED ORDER — LIDOCAINE 2% (20 MG/ML) 5 ML SYRINGE
INTRAMUSCULAR | Status: AC
  Filled 2016-01-25: qty 5

## 2016-01-25 MED ORDER — POLYETHYLENE GLYCOL 3350 17 G PO PACK: 17.0000 g | PACK | Freq: Every day | ORAL | Status: DC | PRN

## 2016-01-25 MED ORDER — LIDOCAINE-EPINEPHRINE (PF) 1 %-1:200000 IJ SOLN
INTRAMUSCULAR | Status: DC | PRN
  Administered 2016-01-25: 20 mL

## 2016-01-25 MED ORDER — NICOTINE 14 MG/24HR TD PT24
14.0000 mg | MEDICATED_PATCH | Freq: Every day | TRANSDERMAL | Status: DC
  Administered 2016-01-25: 14 mg via TRANSDERMAL
  Filled 2016-01-25: qty 1

## 2016-01-25 MED ORDER — FENTANYL CITRATE (PF) 100 MCG/2ML IJ SOLN
INTRAMUSCULAR | Status: DC | PRN
  Administered 2016-01-25: 50 ug via INTRAVENOUS
  Administered 2016-01-25: 100 ug via INTRAVENOUS
  Administered 2016-01-25: 50 ug via INTRAVENOUS

## 2016-01-25 MED ORDER — FENTANYL CITRATE (PF) 100 MCG/2ML IJ SOLN
INTRAMUSCULAR | Status: AC
  Filled 2016-01-25: qty 4

## 2016-01-25 MED ORDER — PROPOFOL 10 MG/ML IV BOLUS
INTRAVENOUS | Status: DC | PRN
  Administered 2016-01-25: 200 mg via INTRAVENOUS

## 2016-01-25 MED ORDER — ACETAMINOPHEN 650 MG RE SUPP: 650.0000 mg | RECTAL | Status: DC | PRN

## 2016-01-25 MED ORDER — ACETAMINOPHEN 325 MG PO TABS: 650.0000 mg | ORAL_TABLET | ORAL | Status: DC | PRN

## 2016-01-25 MED ORDER — LACTATED RINGERS IV SOLN
INTRAVENOUS | Status: DC
  Administered 2016-01-25 (×2): via INTRAVENOUS

## 2016-01-25 MED ORDER — BUPIVACAINE LIPOSOME 1.3 % IJ SUSP
20.0000 mL | Freq: Once | INTRAMUSCULAR | Status: DC
  Filled 2016-01-25: qty 20

## 2016-01-25 MED ORDER — SODIUM CHLORIDE 0.9 % IR SOLN
Status: DC | PRN
  Administered 2016-01-25: 500 mL

## 2016-01-25 MED ORDER — MEPERIDINE HCL 50 MG/ML IJ SOLN: 6.2500 mg | INTRAMUSCULAR | Status: DC | PRN

## 2016-01-25 MED ORDER — OXYCODONE HCL 5 MG PO TABS: 5.0000 mg | ORAL_TABLET | Freq: Once | ORAL | Status: DC | PRN

## 2016-01-25 MED ORDER — LATANOPROST 0.005 % OP SOLN
1.0000 [drp] | Freq: Every day | OPHTHALMIC | Status: DC
  Administered 2016-01-25: 21:00:00 1 [drp] via OPHTHALMIC
  Filled 2016-01-25: qty 2.5

## 2016-01-25 MED ORDER — METHOCARBAMOL 500 MG PO TABS: 500.0000 mg | ORAL_TABLET | Freq: Four times a day (QID) | ORAL | 0 refills | Status: DC | PRN

## 2016-01-25 SURGICAL SUPPLY — 56 items
APL SKNCLS STERI-STRIP NONHPOA (GAUZE/BANDAGES/DRESSINGS) ×1
BAG SPEC THK2 15X12 ZIP CLS (MISCELLANEOUS)
BAG ZIPLOCK 12X15 (MISCELLANEOUS) IMPLANT
BENZOIN TINCTURE PRP APPL 2/3 (GAUZE/BANDAGES/DRESSINGS) ×2 IMPLANT
CLEANER TIP ELECTROSURG 2X2 (MISCELLANEOUS) ×2 IMPLANT
DRAPE MICROSCOPE LEICA (MISCELLANEOUS) ×2 IMPLANT
DRAPE POUCH INSTRU U-SHP 10X18 (DRAPES) ×2 IMPLANT
DRAPE SHEET LG 3/4 BI-LAMINATE (DRAPES) ×2 IMPLANT
DRAPE SURG 17X11 SM STRL (DRAPES) ×2 IMPLANT
DRSG ADAPTIC 3X8 NADH LF (GAUZE/BANDAGES/DRESSINGS) ×2 IMPLANT
DURAPREP 26ML APPLICATOR (WOUND CARE) ×2 IMPLANT
ELECT BLADE TIP CTD 4 INCH (ELECTRODE) ×2 IMPLANT
ELECT REM PT RETURN 9FT ADLT (ELECTROSURGICAL) ×2
ELECTRODE REM PT RTRN 9FT ADLT (ELECTROSURGICAL) ×1 IMPLANT
GAUZE SPONGE 4X4 12PLY STRL (GAUZE/BANDAGES/DRESSINGS) ×2 IMPLANT
GLOVE BIOGEL PI IND STRL 6.5 (GLOVE) ×1 IMPLANT
GLOVE BIOGEL PI IND STRL 7.5 (GLOVE) ×1 IMPLANT
GLOVE BIOGEL PI IND STRL 8 (GLOVE) ×3 IMPLANT
GLOVE BIOGEL PI INDICATOR 6.5 (GLOVE) ×1
GLOVE BIOGEL PI INDICATOR 7.5 (GLOVE) ×1
GLOVE BIOGEL PI INDICATOR 8 (GLOVE) ×3
GLOVE ECLIPSE 8.0 STRL XLNG CF (GLOVE) ×6 IMPLANT
GLOVE SURG SS PI 6.5 STRL IVOR (GLOVE) ×1 IMPLANT
GLOVE SURG SS PI 8.0 STRL IVOR (GLOVE) ×1 IMPLANT
GOWN STRL REUS W/TWL 2XL LVL3 (GOWN DISPOSABLE) ×1 IMPLANT
GOWN STRL REUS W/TWL LRG LVL3 (GOWN DISPOSABLE) ×1 IMPLANT
GOWN STRL REUS W/TWL XL LVL3 (GOWN DISPOSABLE) ×3 IMPLANT
KIT BASIN OR (CUSTOM PROCEDURE TRAY) ×2 IMPLANT
KIT POSITIONING SURG ANDREWS (MISCELLANEOUS) ×2 IMPLANT
MANIFOLD NEPTUNE II (INSTRUMENTS) ×2 IMPLANT
MARKER SKIN DUAL TIP RULER LAB (MISCELLANEOUS) ×2 IMPLANT
NDL SPNL 18GX3.5 QUINCKE PK (NEEDLE) ×2 IMPLANT
NEEDLE HYPO 22GX1.5 SAFETY (NEEDLE) ×2 IMPLANT
NEEDLE SPNL 18GX3.5 QUINCKE PK (NEEDLE) ×4 IMPLANT
PACK LAMINECTOMY ORTHO (CUSTOM PROCEDURE TRAY) ×2 IMPLANT
PAD ABD 8X10 STRL (GAUZE/BANDAGES/DRESSINGS) ×8 IMPLANT
PATTIES SURGICAL .5 X.5 (GAUZE/BANDAGES/DRESSINGS) ×2 IMPLANT
PATTIES SURGICAL .75X.75 (GAUZE/BANDAGES/DRESSINGS) ×2 IMPLANT
PATTIES SURGICAL 1X1 (DISPOSABLE) ×2 IMPLANT
POSITIONER SURGICAL ARM (MISCELLANEOUS) ×2 IMPLANT
RUBBERBAND STERILE (MISCELLANEOUS) ×2 IMPLANT
SPONGE LAP 4X18 X RAY DECT (DISPOSABLE) ×5 IMPLANT
SPONGE SURGIFOAM ABS GEL 100 (HEMOSTASIS) ×2 IMPLANT
STAPLER VISISTAT 35W (STAPLE) ×2 IMPLANT
SUT VIC AB 0 CT1 27 (SUTURE)
SUT VIC AB 0 CT1 27XBRD ANTBC (SUTURE) IMPLANT
SUT VIC AB 1 CT1 27 (SUTURE) ×6
SUT VIC AB 1 CT1 27XBRD ANTBC (SUTURE) ×3 IMPLANT
SUT VIC AB 2-0 CT1 27 (SUTURE) ×2
SUT VIC AB 2-0 CT1 TAPERPNT 27 (SUTURE) IMPLANT
SYR 20CC LL (SYRINGE) ×4 IMPLANT
TAPE CLOTH SURG 4X10 WHT LF (GAUZE/BANDAGES/DRESSINGS) ×2 IMPLANT
TOWEL OR 17X26 10 PK STRL BLUE (TOWEL DISPOSABLE) ×2 IMPLANT
TOWEL OR NON WOVEN STRL DISP B (DISPOSABLE) ×2 IMPLANT
TRAY FOLEY W/METER SILVER 16FR (SET/KITS/TRAYS/PACK) ×1 IMPLANT
YANKAUER SUCT BULB TIP NO VENT (SUCTIONS) ×1 IMPLANT

## 2016-01-25 NOTE — H&P (Signed)
Xavier Campbell is an 61 y.o. male.   Chief Complaint: pain and weakness in his right Leg. HPI: He has had progresssive weakness in his right lower extremity,His Myelogram showed a complete Block at L-3-L-4.  Past Medical History:  Diagnosis Date  . Alcoholic ([place])    former treatment in 2009, patient denies at 01/18/16 preop visit   . Allergy    seasonal  . Arthritis   . Cataract    bilateral  . Dizziness - light-headed    if patient stands up to fast  . Drug abuse    hx of crack abuse, patient denies at 01/18/2016 preop visit   . GERD (gastroesophageal reflux disease)   . Glaucoma    patient denies at preop visit of 01/18/16   . Hypertension   . Hypertrophy of prostate without urinary obstruction and other lower urinary tract symptoms (LUTS)   . Lipoma   . Osteoarthritis   . Sickle cell anemia (HCC)    pt has the trait    Past Surgical History:  Procedure Laterality Date  . ANTERIOR CERVICAL DECOMP/DISCECTOMY FUSION  09/13/2011   Procedure: ANTERIOR CERVICAL DECOMPRESSION/DISCECTOMY FUSION 1 LEVEL;  Surgeon: Otilio Connors, MD;  Location: [facility];  Service: Neurosurgery;  Laterality: Bilateral;  Anterior Cervical Three-Four Decompression/Diskectomy and fusion  . APPENDECTOMY    . CATARACT EXTRACTION    . CERVICAL FUSION    . COLONOSCOPY    . HERNIA REPAIR    . NECK SURGERY    . ORBITAL FX REPAIR     CLEARED FOR MRI.    Family History  Problem Relation Age of Onset  . Heart disease Father   . Heart disease Mother   . Diabetes Mother   . Colon cancer Neg Hx    Social History:  reports that he has been smoking.  He has a 10.00 pack-year smoking history. He has never used smokeless tobacco. He reports that he does not drink alcohol or use drugs.  Allergies: No Known Allergies  Medications Prior to Admission  Medication Sig Dispense Refill  . amitriptyline (ELAVIL) 100 MG tablet Take 100 mg by mouth at bedtime.    . brimonidine-timolol (COMBIGAN) 0.2-0.5 %  ophthalmic solution Place 1 drop into the left eye every 12 (twelve) hours.    Marland Kitchen escitalopram (LEXAPRO) 20 MG tablet Take 20 mg by mouth daily.    . ferrous sulfate 325 (65 FE) MG EC tablet Take 650 mg by mouth 3 (three) times daily with meals.    Marland Kitchen HYDROcodone-acetaminophen (NORCO) 5-325 MG per tablet Take 1 tablet by mouth every 6 (six) hours as needed. For pain    . lisinopril (PRINIVIL,ZESTRIL) 10 MG tablet Take 10 mg by mouth daily.    Marland Kitchen omeprazole (PRILOSEC) 20 MG capsule Take 20 mg by mouth daily.    . Travoprost, BAK Free, (TRAVATAN) 0.004 % SOLN ophthalmic solution Place 1 drop into both eyes at bedtime.    Marland Kitchen zolpidem (AMBIEN) 10 MG tablet Take 10 mg by mouth at bedtime.      No results found for this or any previous visit (from the past 48 hour(s)). No results found.  Review of Systems  Constitutional: Negative.   HENT: Negative.   Eyes: Negative.   Respiratory: Negative.   Cardiovascular: Negative.   Gastrointestinal: Negative.   Genitourinary: Negative.   Musculoskeletal: Positive for back pain. Negative for joint pain.  Skin: Negative.   Neurological: Positive for focal weakness. Negative for sensory change.  Endo/Heme/Allergies: Negative.   Psychiatric/Behavioral: Negative.     Blood pressure 130/90, pulse 65, temperature 98.4 F (36.9 C), temperature source Oral, resp. rate 16, height 6' (1.829 m), weight 74.8 kg (165 lb), SpO2 100 %. Physical Exam  Constitutional: He appears well-developed.  Eyes: Pupils are equal, round, and reactive to light.  Neck: Normal range of motion.  Cardiovascular: Normal rate.   Respiratory: Effort normal.  GI: Soft.  Musculoskeletal: He exhibits tenderness.  Neurological:  Weakness of his Right Foot Dorsiflexors.  Skin: Skin is warm.  Psychiatric: He has a normal mood and affect.     Assessment/Plan Complete decompressive lumbar Laminectomies at L-3-L-4 and L-4-L-5 for Spinal Stenosis  Clavin Ruhlman A, MD 01/25/2016, 8:22  AM

## 2016-01-25 NOTE — Transfer of Care (Signed)
Immediate Anesthesia Transfer of Care Note  Patient: Xavier Campbell  Procedure(s) Performed: Procedure(s): Central decompression, lumbar laminectomy L3-L4 and L4-L5, and bilateral foraminotomy L4-L5 (Bilateral)  Patient Location: PACU  Anesthesia Type:General  Level of Consciousness:  sedated, patient cooperative and responds to stimulation  Airway & Oxygen Therapy:Patient Spontanous Breathing and Patient connected to face mask oxgen  Post-op Assessment:  Report given to PACU RN and Post -op Vital signs reviewed and stable  Post vital signs:  Reviewed and stable  Last Vitals:  Vitals:   01/25/16 0619 01/25/16 0642  BP: (!) 170/96 130/90  Pulse:  65  Resp:  16  Temp:  123456 C    Complications: No apparent anesthesia complications

## 2016-01-25 NOTE — Discharge Instructions (Signed)
For the first three days, remove your dressing, tape a piece of saran wrap over your incision Take your shower, then remove the saran wrap and put a clean dressing on. After three days you can shower without the saran wrap.  No driving while taking pain medication. No lifting or bending.  Call Dr. Gladstone Lighter if any wound complications or temperature of 101 degrees F or over.  Call the office for an appointment to see Dr. Gladstone Lighter in two weeks: [phone] and ask for Dr. Charlestine Night nurse, Brunilda Payor.

## 2016-01-25 NOTE — Interval H&P Note (Signed)
History and Physical Interval Note:  01/25/2016 8:29 AM  Xavier Campbell  has presented today for surgery, with the diagnosis of Spinal Stenosis L3-L4, L4-L5  The various methods of treatment have been discussed with the patient and family. After consideration of risks, benefits and other options for treatment, the patient has consented to  Procedure(s): Central decompression, lumbar laminectomy L3-L4, L4-L5 (N/A) as a surgical intervention .  The patient's history has been reviewed, patient examined, no change in status, stable for surgery.  I have reviewed the patient's chart and labs.  Questions were answered to the patient's satisfaction.     [name] A

## 2016-01-25 NOTE — Anesthesia Procedure Notes (Signed)
Procedure Name: Intubation Date/Time: 01/25/2016 8:45 AM Performed by: Talbot Grumbling Pre-anesthesia Checklist: Patient identified, Emergency Drugs available, Suction available and Patient being monitored Patient Re-evaluated:Patient Re-evaluated prior to inductionOxygen Delivery Method: Circle system utilized Intubation Type: IV induction Ventilation: Mask ventilation without difficulty Laryngoscope Size: Mac and 3 Grade View: Grade I Tube type: Oral Tube size: 7.5 mm Number of attempts: 1 Airway Equipment and Method: Stylet Placement Confirmation: ETT inserted through vocal cords under direct vision,  breath sounds checked- equal and bilateral and positive ETCO2 Secured at: 23 cm Tube secured with: Tape Dental Injury: Teeth and Oropharynx as per pre-operative assessment

## 2016-01-25 NOTE — Brief Op Note (Signed)
01/25/2016  10:42 AM  PATIENT:  Xavier Campbell  61 y.o. male  PRE-OPERATIVE DIAGNOSIS:  Spinal Stenosis L3-L4, L4-L5  POST-OPERATIVE DIAGNOSIS:  Spinal Stenosis L3-L4, L4-L5 and Foraminal Stenosis at TWO levels and Bilaterally.  PROCEDURE:  Procedure(s): Central decompression, lumbar laminectomy L3-L4 and L4-L5, and bilateral foraminotomy L4-L5 (Bilateral) and L-3-L-4 for spinal Stenosis.  SURGEON:  Surgeon(s) and Role:    * Latanya Maudlin, MD - Primary  PHYSICIAN ASSISTANT: Ardeen Jourdain PA  ASSISTANTS: Ardeen Jourdain PA   ANESTHESIA:   general  EBL:  Total I/O In: 1000 [I.V.:1000] Out: 600 [Urine:500; Blood:100]  BLOOD ADMINISTERED:none  DRAINS: none   LOCAL MEDICATIONS USED:  LIDOCAINE 1% with Epinephrine 20cc at the start of the case and Exparel 20cc at the end of the case.  SPECIMEN:  No Specimen  DISPOSITION OF SPECIMEN:  N/A  COUNTS:  YES  TOURNIQUET:  * No tourniquets in log *  DICTATION: .Other Dictation: Dictation Number [id number]  PLAN OF CARE: Admit for overnight observation  PATIENT DISPOSITION:  PACU - hemodynamically stable.   Delay start of Pharmacological VTE agent (>24hrs) due to surgical blood loss or risk of bleeding: yes

## 2016-01-25 NOTE — Anesthesia Postprocedure Evaluation (Signed)
Anesthesia Post Note  Patient: Xavier Campbell  Procedure(s) Performed: Procedure(s) (LRB): Central decompression, lumbar laminectomy L3-L4 and L4-L5, and bilateral foraminotomy L4-L5 (Bilateral)  Patient location during evaluation: PACU Anesthesia Type: General Level of consciousness: sedated and patient cooperative Pain management: pain level controlled Vital Signs Assessment: post-procedure vital signs reviewed and stable Respiratory status: spontaneous breathing Cardiovascular status: stable Anesthetic complications: no       Last Vitals:  Vitals:   01/25/16 1345 01/25/16 1400  BP: (!) 132/97 (!) 124/91  Pulse: 85 80  Resp: (!) 22 17  Temp:      Last Pain:  Vitals:   01/25/16 1400  TempSrc:   PainSc: 4           RLE Sensation: Full sensation (01/25/16 1400)      Nolon Nations

## 2016-01-25 NOTE — Anesthesia Preprocedure Evaluation (Signed)
Anesthesia Evaluation  Patient identified by MRN, date of birth, ID band Patient awake    Reviewed: Allergy & Precautions, NPO status , Patient's Chart, lab work & pertinent test results  Airway Mallampati: II  TM Distance: >3 FB Neck ROM: Full    Dental no notable dental hx.    Pulmonary Current Smoker,    Pulmonary exam normal breath sounds clear to auscultation       Cardiovascular hypertension, negative cardio ROS Normal cardiovascular exam Rhythm:Regular Rate:Normal     Neuro/Psych PSYCHIATRIC DISORDERS negative neurological ROS  negative psych ROS   GI/Hepatic GERD  ,(+)     substance abuse  alcohol use,   Endo/Other  negative endocrine ROS  Renal/GU negative Renal ROS     Musculoskeletal negative musculoskeletal ROS (+) Arthritis ,   Abdominal   Peds  Hematology negative hematology ROS (+)   Anesthesia Other Findings   Reproductive/Obstetrics negative OB ROS                            Anesthesia Physical Anesthesia Plan  ASA: III  Anesthesia Plan: General   Post-op Pain Management:    Induction: Intravenous  Airway Management Planned: Oral ETT  Additional Equipment:   Intra-op Plan:   Post-operative Plan: Extubation in OR  Informed Consent: I have reviewed the patients History and Physical, chart, labs and discussed the procedure including the risks, benefits and alternatives for the proposed anesthesia with the patient or authorized representative who has indicated his/her understanding and acceptance.   Dental advisory given  Plan Discussed with: CRNA  Anesthesia Plan Comments:         Anesthesia Quick Evaluation

## 2016-01-26 DIAGNOSIS — M48062 Spinal stenosis, lumbar region with neurogenic claudication: Secondary | ICD-10-CM | POA: Diagnosis not present

## 2016-01-26 NOTE — Op Note (Signed)
NAMEUNIQUE, DARTY                ACCOUNT NO.:  [id number]  MEDICAL RECORD NO.:  [id number]  LOCATION:                                 FACILITY:  PHYSICIAN:  Kipp Brood. Ilyana Manuele, M.D.     DATE OF BIRTH:  DATE OF PROCEDURE:  01/25/2016 DATE OF DISCHARGE:                              OPERATIVE REPORT   SURGEON:  Kipp Brood. Gladstone Lighter, M.D.  ASSISTANT:  Ardeen Jourdain, [place].  OPERATION: 1. Central decompressive lumbar laminectomy for complete block at L3-4     for spinal stenosis. 2. Central decompressive lumbar laminectomy at L4-5 for complete block     secondary to spinal stenosis.  PREOPERATIVE DIAGNOSES: 1. Foraminal stenosis involving the L4 root. 2. Foraminal stenosis involving the L5 root bilaterally for both     roots.  POSTOPERATIVE DIAGNOSES: 1. Foraminal stenosis involving the L4 root. 2. Foraminal stenosis involving the L5 root bilaterally for both     roots.  DESCRIPTION OF PROCEDURE:  Under general anesthesia, routine orthopedic prep and draping of the lower back was carried out with the patient on a spinal frame.  The patient had 2 g of IV Ancef.  Appropriate time-out was first carried out and also marked the appropriate right side of the back, even though we were going central, I marked the right-sided back as he was mainly symptomatic on the right and he was weak on the right lower extremity.  At this time, 2 needles were placed in the back for localization purposes.  X-ray was taken.  Incision then was made after I injected 20 mL of 1% lidocaine with epinephrine into the soft tissue. After this, bleeders were identified and cauterized.  The muscle was stripped from the lamina and spinous process bilaterally.  At this particular time, I then went down and put 2 Kochers, one on the spinous process of L3 and one on the spinous process of L4.  X-ray was taken. Following that, the [employer] retractors were inserted.  I then carried out my complete decompressive  lumbar laminectomies at L3-L4, 2 levels highlighted levels.  I went down and removed the lamina bilaterally at both levels as well and I exposed the ligamentum flavum.  The microscope was brought in.  Note, a small cottonoid was placed between the dura and ligamentum flavum and completely removed the ligamentum flavum to decompress the areas.  We went out laterally to decompressed the foramina as well.  Of note, he had significant stenosis.  After the decompression, I then utilized a hockey-stick to go proximally, distally, and out laterally on both sides to make sure we had wide open canal and we did.  I thoroughly irrigated out the area.  Good hemostasis was maintained.  Loosely applied some thrombin-soaked Gelfoam in the surrounding areas.  The deep structures of the wound then was closed with #1 Vicryl suture.  I left a small distal deep and proximal deep part of the wound open for drainage purposes.  Subcu was closed in the usual fashion, skin with metal staples, and a sterile Neosporin dressing was applied.  As I did state, the patient did have 2 g of IV Ancef.  ______________________________ Kipp Brood Gladstone Lighter, M.D.     RAG/MEDQ  D:  01/25/2016  T:  01/26/2016  Job:  [id number]

## 2016-01-26 NOTE — Progress Notes (Signed)
Subjective: 1 Day Post-Op Procedure(s) (LRB): Central decompression, lumbar laminectomy L3-L4 and L4-L5, and bilateral foraminotomy L4-L5 (Bilateral) Patient reports pain as 1 on 0-10 scale. Doing very well. Right leg strength is back to normal. Dressing is dry. Will DC   Objective: Vital signs in last 24 hours: Temp:  [97.5 F (36.4 C)-98.2 F (36.8 C)] 98.1 F (36.7 C) (01/11 0506) Pulse Rate:  [69-87] 83 (01/11 0506) Resp:  [11-26] 16 (01/11 0506) BP: (108-142)/(80-101) 122/82 (01/11 0506) SpO2:  [94 %-100 %] 97 % (01/11 0506) Weight:  [74.8 kg (165 lb)] 74.8 kg (165 lb) (01/10 1543)  Intake/Output from previous day: 01/10 0701 - 01/11 0700 In: 3216.7 [P.O.:1040; I.V.:2026.7; IV Piggyback:150] Out: 3000 [Urine:2900; Blood:100] Intake/Output this shift: No intake/output data recorded.  No results for input(s): HGB in the last 72 hours. No results for input(s): WBC, RBC, HCT, PLT in the last 72 hours. No results for input(s): NA, K, CL, CO2, BUN, CREATININE, GLUCOSE, CALCIUM in the last 72 hours. No results for input(s): LABPT, INR in the last 72 hours.  Dorsiflexion/Plantar flexion intact  Assessment/Plan: 1 Day Post-Op Procedure(s) (LRB): Central decompression, lumbar laminectomy L3-L4 and L4-L5, and bilateral foraminotomy L4-L5 (Bilateral) Up with therapy Discharge home with home health  Cap Massi A 01/26/2016, 7:28 AM

## 2016-01-26 NOTE — Progress Notes (Signed)
CSW consulted for SNF placement. PN reviewed. MD notes pt will d/c home today with [facility] services. RNCM will assist with d/c planning.  Werner Lean LCSW [phone]

## 2016-01-26 NOTE — Evaluation (Signed)
Physical Therapy Evaluation Patient Details Name: Xavier Campbell MRN: [id number] DOB: 07-25-1955 Today's Date: 01/26/2016   History of Present Illness  Pt s/p L3-4, [name]  Clinical Impression  Pt s/p back surgery and presenting with functional mobility limitations 2* post op pain and back precautions.  Pt currently mobilizing at sup/IND level and planning dc home this date with assist of family/friends.    Follow Up Recommendations No PT follow up    Equipment Recommendations  None recommended by PT    Recommendations for Other Services       Precautions / Restrictions Precautions Precautions: Back Precaution Booklet Issued: Yes (comment) Restrictions Weight Bearing Restrictions: No      Mobility  Bed Mobility Overal bed mobility: Needs Assistance Bed Mobility: Sit to Supine;Supine to Sit     Supine to sit: Min guard;Supervision Sit to supine: Min guard;Supervision   General bed mobility comments: cues for log roll technique - repeated x 2  Transfers Overall transfer level: Needs assistance Equipment used: Rolling walker (2 wheeled) Transfers: Sit to/from [facility] Sit to Stand: Supervision Stand pivot transfers: Supervision       General transfer comment: VC for hand placement  Ambulation/Gait Ambulation/Gait assistance: Supervision;Independent Ambulation Distance (Feet): 400 Feet Assistive device: None Gait Pattern/deviations: Step-through pattern;Decreased step length - right;Decreased step length - left;Shuffle;Wide base of support     General Gait Details: Pt ambulating unassisted demonstrating good stability and good safety awareness  Stairs Stairs: Yes Stairs assistance: Min guard Stair Management: One rail Left;Forwards;Step to pattern Number of Stairs: 4    Wheelchair Mobility    Modified Rankin (Stroke Patients Only)       Balance Overall balance assessment: No apparent balance deficits (not formally  assessed)                                           Pertinent Vitals/Pain Pain Assessment: 0-10 Pain Score: 3  Pain Location: back Pain Descriptors / Indicators: Tender;Sore Pain Intervention(s): Limited activity within patient's tolerance;Monitored during session;Premedicated before session    [place] expects to be discharged to:: Private residence Living Arrangements: Alone Available Help at Discharge: Family;Friend(s) Type of Home: House Home Access: Stairs to enter Entrance Stairs-Rails: Left Entrance Stairs-Number of Steps: 3 Home Layout: One level Home Equipment: None Additional Comments: Pt states he can borrow RW from friend if needed    Prior Function Level of Independence: Independent               Hand Dominance        Extremity/Trunk Assessment   Upper Extremity Assessment Upper Extremity Assessment: Overall WFL for tasks assessed    Lower Extremity Assessment Lower Extremity Assessment: Overall WFL for tasks assessed       Communication   Communication: No difficulties  Cognition Arousal/Alertness: Awake/alert Behavior During Therapy: WFL for tasks assessed/performed Overall Cognitive Status: Within Functional Limits for tasks assessed                      General Comments      Exercises     Assessment/Plan    PT Assessment Patent does not need any further PT services  PT Problem List            PT Treatment Interventions      PT Goals (Current goals can be found in the Care  Plan section)  Acute Rehab PT Goals Patient Stated Goal: less pain PT Goal Formulation: All assessment and education complete, DC therapy    Frequency     Barriers to discharge        Co-evaluation               End of Session   Activity Tolerance: Patient tolerated treatment well Patient left: in chair;with call bell/phone within reach Nurse Communication: Mobility status    Functional  Assessment Tool Used: Clinical judgement Functional Limitation: Mobility: Walking and moving around Mobility: Walking and Moving Around Current Status [id number]): At least 1 percent but less than 20 percent impaired, limited or restricted Mobility: Walking and Moving Around Goal Status [phone]): At least 1 percent but less than 20 percent impaired, limited or restricted Mobility: Walking and Moving Around Discharge Status [phone]): At least 1 percent but less than 20 percent impaired, limited or restricted    Time: 0813-0834 PT Time Calculation (min) (ACUTE ONLY): 21 min   Charges:   PT Evaluation $PT Eval Low Complexity: 1 Procedure     PT G Codes:   PT G-Codes **NOT FOR INPATIENT CLASS** Functional Assessment Tool Used: Clinical judgement Functional Limitation: Mobility: Walking and moving around Mobility: Walking and Moving Around Current Status [id number]): At least 1 percent but less than 20 percent impaired, limited or restricted Mobility: Walking and Moving Around Goal Status [phone]): At least 1 percent but less than 20 percent impaired, limited or restricted Mobility: Walking and Moving Around Discharge Status [phone]): At least 1 percent but less than 20 percent impaired, limited or restricted    [facility] 01/26/2016, 12:33 PM

## 2016-01-26 NOTE — Evaluation (Signed)
Occupational Therapy Evaluation Patient Details Name: Xavier Campbell MRN: [id number] DOB: 1955-03-30 Today's Date: 01/26/2016    History of Present Illness s/p back surgery per Dr Rushie Nyhan   Clinical Impression   OT education complete regarding ADL activity and back surgery    Follow Up Recommendations  No OT follow up    Equipment Recommendations  None recommended by OT;Other (comment) (AE issued)    Recommendations for Other Services       Precautions / Restrictions Precautions Precautions: Back      Mobility Bed Mobility               General bed mobility comments: pt in chair  Transfers Overall transfer level: Needs assistance Equipment used: Rolling walker (2 wheeled) Transfers: Sit to/from Stand;Stand Pivot Transfers Sit to Stand: Supervision Stand pivot transfers: Supervision       General transfer comment: VC fo hand placement         ADL Overall ADL's : Needs assistance/impaired                     Lower Body Dressing: Supervision/safety;Sit to/from stand;Cueing for back precautions;With adaptive equipment;Cueing for safety;Cueing for compensatory techniques;Cueing for sequencing   Toilet Transfer: Supervision/safety;RW;Ambulation;Cueing for safety;Cueing for sequencing   Toileting- Clothing Manipulation and Hygiene: Supervision/safety;Cueing for sequencing;Cueing for safety;Cueing for back precautions;Sit to/from stand   Tub/ Shower Transfer: Supervision/safety;Cueing for sequencing;Cueing for safety     General ADL Comments: Pt overall S- min A with ADL activity . AE issued for ADL activity with back precautions.  OT educated pt on ADL activity with back precautions. Pt able to return demonstrate.     Vision     Perception     Praxis      Pertinent Vitals/Pain Pain Assessment: 0-10 Pain Score: 3  Pain Location: back Pain Descriptors / Indicators: Tender;Sore Pain Intervention(s): Monitored during  session;Repositioned     Hand Dominance     Extremity/Trunk Assessment Upper Extremity Assessment Upper Extremity Assessment: Overall WFL for tasks assessed           Communication Communication Communication: No difficulties   Cognition Arousal/Alertness: Awake/alert Behavior During Therapy: WFL for tasks assessed/performed Overall Cognitive Status: Within Functional Limits for tasks assessed                                Home Living Family/patient expects to be discharged to:: Private residence Living Arrangements: Alone Available Help at Discharge: Family Type of Home: House Home Access: Stairs to enter     Home Layout: One level     Bathroom Shower/Tub: Occupational psychologist: Handicapped height     Home Equipment: None          Prior Functioning/Environment Level of Independence: Independent                       OT Goals(Current goals can be found in the care plan section) Acute Rehab OT Goals Patient Stated Goal: less pain OT Goal Formulation: With patient  OT Frequency:     Barriers to D/C:               End of Session Equipment Utilized During Treatment: Rolling walker Nurse Communication: Mobility status  Activity Tolerance: Patient tolerated treatment well Patient left: in chair;with call bell/phone within reach;with nursing/sitter in room   Time: [id number] OT Time Calculation (min): 31 min  Charges:  OT General Charges $OT Visit: 1 Procedure OT Evaluation $OT Eval Low Complexity: 1 Procedure OT Treatments $Self Care/Home Management : 23-37 mins G-Codes: OT G-codes **NOT FOR INPATIENT CLASS** Functional Assessment Tool Used: clinical observation Functional Limitation: Self care Self Care Current Status [id number]): At least 1 percent but less than 20 percent impaired, limited or restricted Self Care Goal Status [id number]): 0 percent impaired, limited or restricted Self Care Discharge Status [phone]): At  least 1 percent but less than 20 percent impaired, limited or restricted  Crystal, Thereasa Parkin 01/26/2016, 12:17 PM

## 2016-01-27 NOTE — Discharge Summary (Signed)
Physician Discharge Summary   Patient ID: Xavier Campbell MRN: [id number] DOB/AGE: Jun 01, 1955 61 y.o.  Admit date: 01/25/2016 Discharge date: 01/26/2016  Primary Diagnosis: lumbar spinal stenosis   Admission Diagnoses:  Past Medical History:  Diagnosis Date  . Alcoholic ([place])    former treatment in 2009, patient denies at 01/18/16 preop visit   . Allergy    seasonal  . Arthritis   . Cataract    bilateral  . Dizziness - light-headed    if patient stands up to fast  . Drug abuse    hx of crack abuse, patient denies at 01/18/2016 preop visit   . GERD (gastroesophageal reflux disease)   . Glaucoma    patient denies at preop visit of 01/18/16   . Hypertension   . Hypertrophy of prostate without urinary obstruction and other lower urinary tract symptoms (LUTS)   . Lipoma   . Osteoarthritis   . Sickle cell anemia (HCC)    pt has the trait   Discharge Diagnoses:   Active Problems:   Spinal stenosis, lumbar region with neurogenic claudication  Estimated body mass index is 22.38 kg/m as calculated from the following:   Height as of this encounter: 6' (1.829 m).   Weight as of this encounter: 74.8 kg (165 lb).  Procedure:  Procedure(s) (LRB): Central decompression, lumbar laminectomy L3-L4 and L4-L5, and bilateral foraminotomy L4-L5 (Bilateral)   Consults: None  HPI: The patient presented with the chief complaint of low back pain over the last few months. He has had progresssive weakness in his right lower extremity. His CT myelogram showed a complete block at L-3-L-4.  Laboratory Data: Hospital Outpatient Visit on 01/18/2016  Component Date Value Ref Range Status  . aPTT 01/18/2016 32  24 - 36 seconds Final  . WBC 01/18/2016 8.2  4.0 - 10.5 K/uL Final   Comment: WHITE COUNT CONFIRMED ON SMEAR ADJUSTED FOR NUCLEATED RBC'S   . RBC 01/18/2016 5.29  4.22 - 5.81 MIL/uL Final  . Hemoglobin 01/18/2016 11.0* 13.0 - 17.0 g/dL Final  . HCT 01/18/2016 31.5* 39.0 - 52.0 % Final  . MCV  01/18/2016 59.5* 78.0 - 100.0 fL Final  . MCH 01/18/2016 20.8* 26.0 - 34.0 pg Final  . MCHC 01/18/2016 34.9  30.0 - 36.0 g/dL Final  . RDW 01/18/2016 18.1* 11.5 - 15.5 % Final  . Platelets 01/18/2016 200  150 - 400 K/uL Final  . Neutrophils Relative % 01/18/2016 56  % Final  . Lymphocytes Relative 01/18/2016 35  % Final  . Monocytes Relative 01/18/2016 7  % Final  . Eosinophils Relative 01/18/2016 2  % Final  . Basophils Relative 01/18/2016 0  % Final  . nRBC 01/18/2016 2* 0 /100 WBC Final  . Neutro Abs 01/18/2016 4.5  1.7 - 7.7 K/uL Final  . Lymphs Abs 01/18/2016 2.9  0.7 - 4.0 K/uL Final  . Monocytes Absolute 01/18/2016 0.6  0.1 - 1.0 K/uL Final  . Eosinophils Absolute 01/18/2016 0.2  0.0 - 0.7 K/uL Final  . Basophils Absolute 01/18/2016 0.0  0.0 - 0.1 K/uL Final  . RBC Morphology 01/18/2016 POLYCHROMASIA PRESENT   Final   Comment: TARGET CELLS RARE NRBCs   . Smear Review 01/18/2016 LARGE PLATELETS PRESENT   Final  . Sodium 01/18/2016 138  135 - 145 mmol/L Final  . Potassium 01/18/2016 4.2  3.5 - 5.1 mmol/L Final  . Chloride 01/18/2016 103  101 - 111 mmol/L Final  . CO2 01/18/2016 26  22 - 32 mmol/L  Final  . Glucose, Bld 01/18/2016 94  65 - 99 mg/dL Final  . BUN 01/18/2016 12  6 - 20 mg/dL Final  . Creatinine, Ser 01/18/2016 0.86  0.61 - 1.24 mg/dL Final  . Calcium 01/18/2016 9.5  8.9 - 10.3 mg/dL Final  . Total Protein 01/18/2016 7.6  6.5 - 8.1 g/dL Final  . Albumin 01/18/2016 4.6  3.5 - 5.0 g/dL Final  . AST 01/18/2016 22  15 - 41 U/L Final  . ALT 01/18/2016 23  17 - 63 U/L Final  . Alkaline Phosphatase 01/18/2016 71  38 - 126 U/L Final  . Total Bilirubin 01/18/2016 0.9  0.3 - 1.2 mg/dL Final  . GFR calc non Af Amer 01/18/2016 >60  >60 mL/min Final  . GFR calc Af Amer 01/18/2016 >60  >60 mL/min Final   Comment: (NOTE) The eGFR has been calculated using the CKD EPI equation. This calculation has not been validated in all clinical situations. eGFR's persistently <60 mL/min  signify possible Chronic Kidney Disease.   . Anion gap 01/18/2016 9  5 - 15 Final  . Prothrombin Time 01/18/2016 14.0  11.4 - 15.2 seconds Final  . INR 01/18/2016 1.08   Final  . ABO/RH(D) 01/25/2016 O POS   Final  . Antibody Screen 01/25/2016 NEG   Final  . Sample Expiration 01/25/2016 01/28/2016   Final  . Extend sample reason 01/25/2016 NO TRANSFUSIONS OR PREGNANCY IN THE PAST 3 MONTHS   Final  . MRSA, PCR 01/18/2016 NEGATIVE  NEGATIVE Final  . Staphylococcus aureus 01/18/2016 NEGATIVE  NEGATIVE Final   Comment:        The Xpert SA Assay (FDA approved for NASAL specimens in patients over 8 years of age), is one component of a comprehensive surveillance program.  Test performance has been validated by [facility] for patients greater than or equal to 75 year old. It is not intended to diagnose infection nor to guide or monitor treatment.   . ABO/RH(D) 01/18/2016 O POS   Final     X-Rays:Dg Lumbar Spine 2-3 Views  Result Date: 01/18/2016 CLINICAL DATA:  Preoperative examination prior to lumbar surgical procedure. EXAM: LUMBAR SPINE - 2-3 VIEW COMPARISON:  Chest x-ray of September 24, 2013 for purposes of numbering the vertebral levels. Comparison made to the CT myelogram of November 30, 2015. FINDINGS: There are 5 lumbar type non-rib-bearing vertebral bodies. The vertebral bodies are preserved in height. There is moderate disc space narrowing at L4-5 with mild narrowing at L5-S1. There is grade 1 anterolisthesis at L4-5. There is facet joint hypertrophy at L4-5 and at L5-S1. The pedicles and transverse processes are intact. The observed portions of the sacrum are normal. IMPRESSION: Degenerative disc disease centered at L4-5 and L5-S1 with grade 1 anterolisthesis at L4-5. Facet joint hypertrophy is present at L4-5 and L5-S1. There is no compression fracture. Electronically Signed   By: David  Martinique M.D.   On: 01/18/2016 14:17   Dg Spine Portable 1 View  Result Date:  01/25/2016 CLINICAL DATA:  Surgical level L3-4, L4-5. EXAM: PORTABLE SPINE - 1 VIEW COMPARISON:  Plain films 01/18/2016 FINDINGS: Posterior surgical instruments are directed at the L3 and L4 vertebral bodies. IMPRESSION: Intraoperative localization as above. Electronically Signed   By: Rolm Baptise M.D.   On: 01/25/2016 10:05   Dg Spine Portable 1 View  Result Date: 01/25/2016 CLINICAL DATA:  Laminectomy EXAM: PORTABLE SPINE - 1 VIEW COMPARISON:  Study obtained earlier in the day FINDINGS: Cross-table lateral lumbar image  labeled #2 submitted. Metallic probes overlie the L3 and L4 spinous processes respectively. The more superior probe is posterior to the L3-4 interspace ; the more inferior probe tip is posterior to the L4-5 interspace. There is slight anterolisthesis of L4 on L5. No fracture. IMPRESSION: Metallic probes overlie the L3 and L4 spinous processes. Slight anterolisthesis of L4 on L5. Electronically Signed   By: Lowella Grip III M.D.   On: 01/25/2016 09:34   Dg Spine Portable 1 View  Result Date: 01/25/2016 CLINICAL DATA:  Surgical level L3-4 L4-5 EXAM: PORTABLE SPINE - 1 VIEW COMPARISON:  01/18/2016 FINDINGS: Single lateral view in the operating room. Lowest disc space L5-S1. Mild anterior slip L4-5 Needle overlying the L3 spinous process. Needle posterior to the upper portion of the L4 spinous process. IMPRESSION: Surgical localization as above. Electronically Signed   By: Franchot Gallo M.D.   On: 01/25/2016 09:13    EKG: Orders placed or performed during the hospital encounter of 01/18/16  . EKG  . EKG     Hospital Course: Xavier Campbell is a 61 y.o. who was admitted to [facility]. They were brought to the operating room on 01/25/2016 and underwent Procedure(s): Central decompression, lumbar laminectomy L3-L4 and L4-L5, and bilateral foraminotomy L4-L5.  Patient tolerated the procedure well and was later transferred to the recovery room and then to the orthopaedic  floor for postoperative care.  They were given PO and IV analgesics for pain control following their surgery.  They were given 24 hours of postoperative antibiotics of  Anti-infectives    Start     Dose/Rate Route Frequency Ordered Stop   01/25/16 1700  ceFAZolin (ANCEF) IVPB 1 g/50 mL premix     1 g 100 mL/hr over 30 Minutes Intravenous Every 8 hours 01/25/16 1548 01/26/16 0557   01/25/16 0910  polymyxin B 500,000 Units, bacitracin 50,000 Units in sodium chloride irrigation 0.9 % 500 mL irrigation  Status:  Discontinued       As needed 01/25/16 0918 01/25/16 1056   01/25/16 0605  ceFAZolin (ANCEF) IVPB 2g/100 mL premix     2 g 200 mL/hr over 30 Minutes Intravenous On call to O.R. 01/25/16 8527 01/25/16 0855    PT was ordered to ambulate the patient. Discharge planning consulted to help with postop disposition and equipment needs.  Patient had a good night on the evening of surgery.  They started to get up OOB with therapy on day one. Dressing was changed and the incision was clean and dry.  Patient was seen in rounds and was ready to go home.   Diet: Cardiac diet Activity:WBAT Follow-up:in 2 weeks Disposition - Home Discharged Condition: stable   Discharge Instructions    Call MD / Call 911    Complete by:  As directed    If you experience chest pain or shortness of breath, CALL 911 and be transported to the hospital emergency room.  If you develope a fever above 101 F, pus (white drainage) or increased drainage or redness at the wound, or calf pain, call your surgeon's office.   Constipation Prevention    Complete by:  As directed    Drink plenty of fluids.  Prune juice may be helpful.  You may use a stool softener, such as Colace (over the counter) 100 mg twice a day.  Use MiraLax (over the counter) for constipation as needed.   Diet - low sodium heart healthy    Complete by:  As directed  Discharge instructions    Complete by:  As directed    For the first three days, remove  your dressing, tape a piece of saran wrap over your incision Take your shower, then remove the saran wrap and put a clean dressing on. After three days you can shower without the saran wrap.  No driving while taking pain medication. No lifting or bending.  Call Dr. Gladstone Lighter if any wound complications or temperature of 101 degrees F or over.  Call the office for an appointment to see Dr. Gladstone Lighter in two weeks: [phone] and ask for Dr. Charlestine Night nurse, Brunilda Payor.   Incentive spirometry RT    Complete by:  As directed    Increase activity slowly as tolerated    Complete by:  As directed      Allergies as of 01/26/2016   No Known Allergies     Medication List    STOP taking these medications   HYDROcodone-acetaminophen 5-325 MG tablet Commonly known as:  NORCO/VICODIN     TAKE these medications   amitriptyline 100 MG tablet Commonly known as:  ELAVIL Take 100 mg by mouth at bedtime.   COMBIGAN 0.2-0.5 % ophthalmic solution Generic drug:  brimonidine-timolol Place 1 drop into the left eye every 12 (twelve) hours.   escitalopram 20 MG tablet Commonly known as:  LEXAPRO Take 20 mg by mouth daily.   ferrous sulfate 325 (65 FE) MG EC tablet Take 650 mg by mouth 3 (three) times daily with meals.   lisinopril 10 MG tablet Commonly known as:  PRINIVIL,ZESTRIL Take 10 mg by mouth daily.   methocarbamol 500 MG tablet Commonly known as:  ROBAXIN Take 1 tablet (500 mg total) by mouth every 6 (six) hours as needed for muscle spasms.   omeprazole 20 MG capsule Commonly known as:  PRILOSEC Take 20 mg by mouth daily.   oxyCODONE-acetaminophen 5-325 MG tablet Commonly known as:  PERCOCET/ROXICET Take 1-2 tablets by mouth every 4 (four) hours as needed for moderate pain.   Travoprost (BAK Free) 0.004 % Soln ophthalmic solution Commonly known as:  TRAVATAN Place 1 drop into both eyes at bedtime.   zolpidem 10 MG tablet Commonly known as:  AMBIEN Take 10 mg by mouth at  bedtime.      Follow-up Information    GIOFFRE,RONALD A, MD. Schedule an appointment as soon as possible for a visit in 2 week(s).   Specialty:  Orthopedic Surgery Contact information: [address] [place] [address] [phone]           Signed: Ardeen Jourdain, PA-C Orthopaedic Surgery 01/27/2016, 12:36 PM

## 2016-05-17 ENCOUNTER — Ambulatory Visit: Payer: Medicaid Other | Admitting: Physician Assistant

## 2016-05-24 ENCOUNTER — Encounter: Payer: Self-pay | Admitting: Physician Assistant

## 2016-05-24 ENCOUNTER — Ambulatory Visit (INDEPENDENT_AMBULATORY_CARE_PROVIDER_SITE_OTHER): Payer: Medicaid Other | Admitting: Physician Assistant

## 2016-05-24 VITALS — BP 100/60 | HR 60 | Ht 72.0 in | Wt 165.0 lb

## 2016-05-24 DIAGNOSIS — K6289 Other specified diseases of anus and rectum: Secondary | ICD-10-CM

## 2016-05-24 DIAGNOSIS — K625 Hemorrhage of anus and rectum: Secondary | ICD-10-CM | POA: Diagnosis not present

## 2016-05-24 MED ORDER — NA SULFATE-K SULFATE-MG SULF 17.5-3.13-1.6 GM/177ML PO SOLN: 1.0000 | Freq: Once | ORAL | 0 refills | Status: AC

## 2016-05-24 MED ORDER — HYDROCORTISONE ACETATE 25 MG RE SUPP: 25.0000 mg | Freq: Every day | RECTAL | 3 refills | Status: DC

## 2016-05-24 MED ORDER — LIDOCAINE 5 % EX OINT: TOPICAL_OINTMENT | CUTANEOUS | 0 refills | Status: DC

## 2016-05-24 NOTE — Patient Instructions (Signed)
If you are age 62 or older, your body mass index should be between 23-30. Your Body mass index is 22.38 kg/m. If this is out of the aforementioned range listed, please consider follow up with your Primary Care Provider.  If you are age 48 or younger, your body mass index should be between 19-25. Your Body mass index is 22.38 kg/m. If this is out of the aformentioned range listed, please consider follow up with your Primary Care Provider.   You have been scheduled for a colonoscopy. Please follow written instructions given to you at your visit today.  Please pick up your prep supplies at the pharmacy within the next 1-3 days. If you use inhalers (even only as needed), please bring them with you on the day of your procedure. Your physician has requested that you go to www.startemmi.com and enter the access code given to you at your visit today. This web site gives a general overview about your procedure. However, you should still follow specific instructions given to you by our office regarding your preparation for the procedure.  We have sent medications to your pharmacy for you to pick up at your convenience.  Continue Preparation H 2-3 times daily as needed.  Thank you for choosing me and [facility].  Amy Esterwood, PA-C

## 2016-05-24 NOTE — Progress Notes (Addendum)
Subjective:    Patient ID: Xavier Campbell, male    DOB: 15-Apr-1955, 61 y.o.   MRN: [id number]  HPI Xavier Campbell is a pleasant 61 year old African-American male, known previously to Xavier Campbell, last seen here in 2015. He comes in today with complaints of rectal pain, intermittent rectal bleeding. Patient has history of GERD, and underwent EGD in 2013 with [facility] dilation of a GE junction stricture. Colonoscopy was done in March 2011 and was normal, no mention of internal hemorrhoids. Other medical problems include remote [place] barr, sickle cell anemia, status post appendectomy, umbilical hernia repair and had a lumbar laminectomy earlier this year. Patient says over the past 3-4 months he has been having ongoing problems with what he feels his hemorrhoids. He denies any problems with constipation or excessive straining. No abdominal pain. Appetite is been fine and weight has been stable. He has noticed intermittent small amounts of bright red blood per rectum usually just seen on the tissue and only with bowel movements. He says intermittently he feels that he gets a protrusion from his rectum which is very uncomfortable. This may be present for a day or 2 and then gradually "goes back" he feels better until symptoms recur. Recently started using Preparation H cream which has helped. Family history negative for colon cancer as far as he is aware  Review of Systems Pertinent positive and negative review of systems were noted in the above HPI section.  All other review of systems was otherwise negative.  Outpatient Encounter Prescriptions as of 05/24/2016  Medication Sig  . amitriptyline (ELAVIL) 100 MG tablet Take 100 mg by mouth at bedtime.  . brimonidine-timolol (COMBIGAN) 0.2-0.5 % ophthalmic solution Place 1 drop into the left eye every 12 (twelve) hours.  Marland Kitchen escitalopram (LEXAPRO) 20 MG tablet Take 20 mg by mouth daily.  Marland Kitchen gabapentin (NEURONTIN) 300 MG capsule Take 300 mg by mouth 3 (three) times  daily.  Marland Kitchen HYDROcodone-acetaminophen (NORCO) 10-325 MG tablet Take 1 tablet by mouth every 4 (four) hours as needed.  Marland Kitchen lisinopril (PRINIVIL,ZESTRIL) 10 MG tablet Take 10 mg by mouth daily.  Marland Kitchen omeprazole (PRILOSEC) 20 MG capsule Take 20 mg by mouth daily.  . simvastatin (ZOCOR) 10 MG tablet Take 10 mg by mouth daily.  . Travoprost, BAK Free, (TRAVATAN) 0.004 % SOLN ophthalmic solution Place 1 drop into both eyes at bedtime.  Marland Kitchen zolpidem (AMBIEN) 10 MG tablet Take 10 mg by mouth at bedtime.  . hydrocortisone (ANUSOL-HC) 25 MG suppository Place 1 suppository (25 mg total) rectally at bedtime. Use for 7 days then as needed for rectal pain  . lidocaine (XYLOCAINE) 5 % ointment Use 2-3 times daily  . Na Sulfate-K Sulfate-Mg Sulf 17.5-3.13-1.6 GM/180ML SOLN Take 1 kit by mouth once.  . [DISCONTINUED] ferrous sulfate 325 (65 FE) MG EC tablet Take 650 mg by mouth 3 (three) times daily with meals.  . [DISCONTINUED] methocarbamol (ROBAXIN) 500 MG tablet Take 1 tablet (500 mg total) by mouth every 6 (six) hours as needed for muscle spasms.  . [DISCONTINUED] oxyCODONE-acetaminophen (PERCOCET/ROXICET) 5-325 MG tablet Take 1-2 tablets by mouth every 4 (four) hours as needed for moderate pain.   No facility-administered encounter medications on file as of 05/24/2016.    No Known Allergies Patient Active Problem List   Diagnosis Date Noted  . Spinal stenosis, lumbar region with neurogenic claudication 01/25/2016  . Arm numbness 04/15/2012  . Displacement of cervical intervertebral disc without myelopathy 03/14/2012  . Disturbance of skin sensation 03/14/2012  .  Stricture and stenosis of esophagus 06/12/2011  . Sickle cell anemia (HCC)   . Esophageal reflux 04/09/2011  . 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 HYPERTROPHY PROSTATE W/O UR OBST & OTH LUTS 10/25/2008  . OTHER HEMOGLOBINOPATHIES 10/15/2008  . LIPOMA 09/24/2008  . ABSCESS, TOOTH 09/24/2008  . OSTEOARTHRITIS, CERVICAL SPINE 09/24/2008  . CHEST PAIN  UNSPECIFIED 09/24/2008  . ORBITAL FLOOR , CLOSED FRACTURE 09/24/2008  . 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 APPENDECTOMY, HX OF 09/24/2008   Social History   Social History  . Marital status: Single    Spouse name: N/A  . Number of children: 2  . Years of education: N/A   Occupational History  . Unemployed    Social History Main Topics  . Smoking status: Current Every Day Smoker    Packs/day: 0.25    Years: 40.00  . Smokeless tobacco: Never Used  . Alcohol use No  . Drug use: No  . Sexual activity: Not on file   Other Topics Concern  . Not on file   Social History Narrative   Daily caffeine    Xavier Campbell's family history includes Diabetes in his mother; Heart disease in his father and mother.      Objective:    Vitals:   05/24/16 1349  BP: 100/60  Pulse: 60    Physical Exam  well-developed older African-American male in no acute distress, pleasant blood pressure 100/60 pulse 60, BMI 22.3. HEENT; nontraumatic normocephalic EOMI PERRLA anicteric, Cardiovascular ;regular rate and rhythm with S1-S2 no murmur or gallop , Pulmonary; clear bilaterally, Abdomen ;soft, nontender nondistended bowel sounds are active there is no palpable mass or hepatosplenomegaly as an incisional scar in the right lower quadrant and also superior to the umbilicus, Recta;l exam small external hemorrhoidal tags noninflamed, is nontender on digital exam no palpable mass or internal hemorrhoids, stool is heme-negative. Extremities; no clubbing cyanosis or edema skin warm and dry, Neuropsych ;mood and affect appropriate       Assessment & Plan:   #28 61 year old African-American male with 3-4 month history of persistent intermittent rectal pain and pressure as well as intermittent small-volume hematochezia. Question intermittently prolapsing internal hemorrhoid, cannot rule out occult colorectal lesion #2 history of GERD status post previous dilation of esophageal stricture 2013 #3  sickle cell anemia #4 status post appendectomy and repair of umbilical hernia  Plan; Patient will continue Preparation H cream 2-3 times daily as needed, will add Anusol HC suppository daily at bedtime 10 days then as needed We'll also send prescription for lidocaine gel 5% to apply 2-3 times daily as needed along with the Preparation H Warm tub soaks twice daily as needed. Will schedule for colonoscopy with Dr. Loletha Carrow. Procedure discussed in detail with the patient including risks and benefits and he is agreeable to proceed. We also briefly discussed in office hemorrhoidal banding if symptoms are found secondary  to internal hemorrhoids. He would be interested in proceeding.  Amy S Esterwood PA-C 05/24/2016   Cc: Pavelock, Ralene Bathe, MD   Thank you for sending this case to me. I have reviewed the entire note, and the outlined plan seems appropriate.   Wilfrid Lund, MD

## 2016-06-04 ENCOUNTER — Telehealth: Payer: Self-pay | Admitting: Gastroenterology

## 2016-06-04 ENCOUNTER — Other Ambulatory Visit: Payer: Self-pay

## 2016-06-04 MED ORDER — NA SULFATE-K SULFATE-MG SULF 17.5-3.13-1.6 GM/177ML PO SOLN: 1.0000 | Freq: Once | ORAL | 0 refills | Status: AC

## 2016-06-04 NOTE — Telephone Encounter (Signed)
New Rx for Suprep sent to the pharmacy as requested by the pt.

## 2016-06-21 ENCOUNTER — Ambulatory Visit (AMBULATORY_SURGERY_CENTER): Payer: Medicaid Other | Admitting: Gastroenterology

## 2016-06-21 ENCOUNTER — Telehealth: Payer: Self-pay | Admitting: Gastroenterology

## 2016-06-21 ENCOUNTER — Encounter: Payer: Self-pay | Admitting: Gastroenterology

## 2016-06-21 VITALS — BP 153/103 | HR 57 | Temp 97.5°F | Resp 18 | Ht 72.0 in | Wt 165.0 lb

## 2016-06-21 DIAGNOSIS — D124 Benign neoplasm of descending colon: Secondary | ICD-10-CM | POA: Diagnosis not present

## 2016-06-21 DIAGNOSIS — D123 Benign neoplasm of transverse colon: Secondary | ICD-10-CM | POA: Diagnosis not present

## 2016-06-21 DIAGNOSIS — K6289 Other specified diseases of anus and rectum: Secondary | ICD-10-CM

## 2016-06-21 DIAGNOSIS — D122 Benign neoplasm of ascending colon: Secondary | ICD-10-CM

## 2016-06-21 DIAGNOSIS — K625 Hemorrhage of anus and rectum: Secondary | ICD-10-CM | POA: Diagnosis not present

## 2016-06-21 DIAGNOSIS — K635 Polyp of colon: Secondary | ICD-10-CM

## 2016-06-21 MED ORDER — SODIUM CHLORIDE 0.9 % IV SOLN: 500.0000 mL | INTRAVENOUS | Status: DC

## 2016-06-21 NOTE — Progress Notes (Signed)
Pt's states no medical or surgical changes since previsit or office visit. 

## 2016-06-21 NOTE — Telephone Encounter (Signed)
Please send a referral to Dr Michael Boston of [facility] for anal lesion Send recent clinic note and today's colonoscopy report.

## 2016-06-21 NOTE — Telephone Encounter (Signed)
Records have been faxed to [facility]. Will await appointment information.

## 2016-06-21 NOTE — Op Note (Signed)
[place] Patient Name: Xavier Campbell Procedure Date: 06/21/2016 11:21 AM MRN: [id number] Endoscopist: Pitkin. Loletha Carrow , MD Age: 61 Referring MD:  Date of Birth: 1955-05-03 Gender: Male Account #: [id number] Procedure:                Colonoscopy Indications:              Rectal bleeding, Rectal pain (complains of                            protruding lesion) Medicines:                Monitored Anesthesia Care Procedure:                Pre-Anesthesia Assessment:                           - Prior to the procedure, a History and Physical                            was performed, and patient medications and                            allergies were reviewed. The patient's tolerance of                            previous anesthesia was also reviewed. The risks                            and benefits of the procedure and the sedation                            options and risks were discussed with the patient.                            All questions were answered, and informed consent                            was obtained. Prior Anticoagulants: The patient has                            taken no previous anticoagulant or antiplatelet                            agents. ASA Grade Assessment: II - A patient with                            mild systemic disease. After reviewing the risks                            and benefits, the patient was deemed in                            satisfactory condition to undergo the procedure.  After obtaining informed consent, the colonoscope                            was passed under direct vision. Throughout the                            procedure, the patient's blood pressure, pulse, and                            oxygen saturations were monitored continuously. The                            Colonoscope was introduced through the anus and                            advanced to the the cecum, identified by                   appendiceal orifice and ileocecal valve. The                            colonoscopy was performed with moderate difficulty                            due to significant looping. Successful completion                            of the procedure was aided by using manual                            pressure. The quality of the bowel preparation was                            good. The ileocecal valve, appendiceal orifice, and                            rectum were photographed. The patient tolerated the                            procedure well. The quality of the bowel                            preparation was evaluated using the BBPS [facility]                            Bowel Preparation Scale) with scores of: Right                            Colon = 2, Transverse Colon = 2 and Left Colon = 2.                            The total BBPS score equals 6. After lavage. The  bowel preparation used was SUPREP. Scope In: 11:38:39 AM Scope Out: 12:08:35 PM Scope Withdrawal Time: 0 hours 11 minutes 44 seconds  Total Procedure Duration: 0 hours 29 minutes 56 seconds  Findings:                 The digital rectal exam findings include a palpable                            anterior mobile , proximal anal lesion. Pertinent                            negatives include no fissure.                           Two sessile polyps were found in the descending                            colon and ascending colon. The polyps were 1 mm in                            size. These polyps were removed with a cold biopsy                            forceps. Resection and retrieval were complete.                           A 2 mm polyp was found in the distal transverse                            colon. The polyp was sessile. The polyp was removed                            with a cold snare. Resection and retrieval were                            complete.                            A hypertrophied anal papilla was noted, correlating                            with the lesion felt on rectal exam.                           The exam was otherwise without abnormality on                            direct and retroflexion views. Complications:            No immediate complications. Estimated Blood Loss:     Estimated blood loss: none. Impression:               - A palpable anterior mobile , proximal anal lesion  found on digital rectal exam.                           - Two 1 mm polyps in the descending colon and in                            the ascending colon, removed with a cold biopsy                            forceps. Resected and retrieved.                           - One 2 mm polyp in the distal transverse colon,                            removed with a cold snare. Resected and retrieved.                           - Anal papilla(e) were hypertrophied.                           - The examination was otherwise normal on direct                            and retroflexion views. Recommendation:           - Patient has a contact number available for                            emergencies. The signs and symptoms of potential                            delayed complications were discussed with the                            patient. Return to normal activities tomorrow.                            Written discharge instructions were provided to the                            patient.                           - Resume previous diet.                           - Continue present medications.                           - Await pathology results.                           - Repeat colonoscopy is recommended for  surveillance. The colonoscopy date will be                            determined after pathology results from today's                            exam become available for review.                           - Refer to  a colo-rectal surgeon at the next                            available appointment. Arriyanna Mersch L. Loletha Carrow, MD 06/21/2016 12:16:39 PM This report has been signed electronically.

## 2016-06-21 NOTE — Patient Instructions (Signed)
YOU HAD AN ENDOSCOPIC PROCEDURE TODAY AT [facility]:   Refer to the procedure report that was given to you for any specific questions about what was found during the examination.  If the procedure report does not answer your questions, please call your gastroenterologist to clarify.  If you requested that your care partner not be given the details of your procedure findings, then the procedure report has been included in a sealed envelope for you to review at your convenience later.  YOU SHOULD EXPECT: Some feelings of bloating in the abdomen. Passage of more gas than usual.  Walking can help get rid of the air that was put into your GI tract during the procedure and reduce the bloating. If you had a lower endoscopy (such as a colonoscopy or flexible sigmoidoscopy) you may notice spotting of blood in your stool or on the toilet paper. If you underwent a bowel prep for your procedure, you may not have a normal bowel movement for a few days.  Please Note:  You might notice some irritation and congestion in your nose or some drainage.  This is from the oxygen used during your procedure.  There is no need for concern and it should clear up in a day or so.  SYMPTOMS TO REPORT IMMEDIATELY:   Following lower endoscopy (colonoscopy or flexible sigmoidoscopy):  Excessive amounts of blood in the stool  Significant tenderness or worsening of abdominal pains  Swelling of the abdomen that is new, acute  Fever of 100F or higher  For urgent or emergent issues, a gastroenterologist can be reached at any hour by calling [phone].   DIET:  We do recommend a small meal at first, but then you may proceed to your regular diet.  Drink plenty of fluids but you should avoid alcoholic beverages for 24 hours.  ACTIVITY:  You should plan to take it easy for the rest of today and you should NOT DRIVE or use heavy machinery until tomorrow (because of the sedation medicines used during the test).     FOLLOW UP: Our staff will call the number listed on your records the next business day following your procedure to check on you and address any questions or concerns that you may have regarding the information given to you following your procedure. If we do not reach you, we will leave a message.  However, if you are feeling well and you are not experiencing any problems, there is no need to return our call.  We will assume that you have returned to your regular daily activities without incident.  If any biopsies were taken you will be contacted by phone or by letter within the next 1-3 weeks.  Please call us at [phone] if you have not heard about the biopsies in 3 weeks.   SIGNATURES/CONFIDENTIALITY: You and/or your care partner have signed paperwork which will be entered into your electronic medical record.  These signatures attest to the fact that that the information above on your After Visit Summary has been reviewed and is understood.  Full responsibility of the confidentiality of this discharge information lies with you and/or your care-partner.  Please read over handouts about polyps and hemorrhoids  Dr. Loletha Carrow' office nurse will call you with an appointment with a surgeon  Please continue your normal medications

## 2016-06-21 NOTE — Progress Notes (Signed)
Report given to PACU, vss 

## 2016-06-21 NOTE — Progress Notes (Signed)
Called to room to assist during endoscopic procedure.  Patient ID and intended procedure confirmed with present staff. Received instructions for my participation in the procedure from the performing physician.  

## 2016-06-22 ENCOUNTER — Telehealth: Payer: Self-pay | Admitting: *Deleted

## 2016-06-22 NOTE — Telephone Encounter (Signed)
  Follow up Call-  Call back number 06/21/2016 11/30/2015  Post procedure Call Back phone  # [phone]  Permission to leave phone message Yes -  Some recent data might be hidden     Patient questions:  Do you have a fever, pain , or abdominal swelling? No. Pain Score  0 *  Have you tolerated food without any problems? Yes.    Have you been able to return to your normal activities? Yes.    Do you have any questions about your discharge instructions: Diet   No. Medications  No. Follow up visit  No.  Do you have questions or concerns about your Care? No.  Actions: * If pain score is 4 or above: No action needed, pain <4.

## 2016-06-22 NOTE — Telephone Encounter (Signed)
No answer, message left for the patient. 

## 2016-06-26 ENCOUNTER — Encounter: Payer: Self-pay | Admitting: Gastroenterology

## 2016-06-26 NOTE — Telephone Encounter (Signed)
Pt has appointment for 07-16-2016 @ 930 am with Dr Johney Maine.

## 2016-07-16 ENCOUNTER — Ambulatory Visit: Payer: Self-pay | Admitting: Surgery

## 2016-07-16 NOTE — H&P (Signed)
Xavier Campbell 07/16/2016 9:40 AM Location: [facility] Patient #: [id number] DOB: 11-04-1955 Single / Language: Xavier Campbell / Race: Black or African American Male  History of Present Illness Adin Hector MD; 07/16/2016 10:12 AM) The patient is a 61 year old male who presents with hemorrhoids. Note for "Hemorrhoids": ` ` ` Patient sent for surgical consultation at the request of Dr. Loletha Carrow  Chief Complaint: Prolapsing anal mass.  The patient is a pleasant male. Smokes a couple packs of cigarettes a week. He struggle with hemorrhoid problems for at least the past year. It's been frustrating for him. He will get a lot of pain and swelling. Now they are persistently popping out. Past push back in. He moves his bowels about 3 times a day. Occasionally uses stool softeners but on a fiber supplement. His first screening colonoscopy was normal. A more recent one did have some polyps. He is due for three-year follow-up. The did note a mass in the anal canal. More consistent with an anal canal polyp. Surgical consultation requested for probable removal since the centimeter most symptomatic hemorrhoid.   No personal nor family history of GI/colon cancer, inflammatory bowel disease, irritable bowel syndrome, allergy such as Celiac Sprue, dietary/dairy problems, colitis, ulcers nor gastritis. No recent sick contacts/gastroenteritis. No travel outside the country. No changes in diet. No dysphagia to solids or liquids. No significant heartburn or reflux. No hematochezia, hematemesis, coffee ground emesis. No evidence of prior gastric/peptic ulceration.  (Review of systems as stated in this history (HPI) or in the review of systems. Otherwise all other 12 point ROS are negative)   Past Surgical History (Xavier Campbell, Xavier Campbell; 07/16/2016 9:40 AM) Colon Polyp Removal - Colonoscopy Colon Removal - Partial  Diagnostic Studies History (Xavier Campbell, [place]; 07/16/2016 9:40  AM) Colonoscopy within last year  Allergies (Xavier Campbell, Xavier Campbell; 07/16/2016 9:43 AM) No Known Drug Allergies 07/16/2016 Allergies Reconciled  Medication History (Xavier A. Xavier Campbell, [place]; 07/16/2016 9:48 AM) Amitriptyline HCl (100MG  Tablet, Oral) Active. Brimonidine Tartrate (0.1% Solution, Ophthalmic) Active. Escitalopram Oxalate (20MG  Tablet, Oral) Active. Lisinopril (10MG  Tablet, Oral) Active. Omeprazole (20MG  Capsule DR, Oral) Active. Simvastatin (10MG  Tablet, Oral) Active. Travoprost (BAK Free) (0.004% Solution, Ophthalmic) Active. Zolpidem Tartrate (10MG  Tablet, Oral) Active. Hydrocortisone (25MG /ML Suspension, Injection) Active. Combigan (0.2-0.5% Solution, Ophthalmic) Active. Flexeril (10MG  Tablet, Oral) Active. Flomax (0.4MG  Capsule, Oral) Active. Medications Reconciled  Social History (Xavier Campbell, [place]; 07/16/2016 9:40 AM) Alcohol use Occasional alcohol use. No drug use Tobacco use Current some day smoker.  Family History (Xavier Campbell, [place]; 07/16/2016 9:40 AM) Alcohol Abuse Father. Diabetes Mellitus Mother. Heart disease in male family member before age 72  Other Problems (Xavier Campbell, Xavier Campbell; 07/16/2016 9:40 AM) Anxiety Disorder Back Pain Depression Hemorrhoids     Review of Systems (Xavier A. Xavier Campbell RMA; 07/16/2016 9:40 AM) General Present- Fatigue. Not Present- Appetite Loss, Chills, Fever, Night Sweats, Weight Gain and Weight Loss. Skin Not Present- Change in Wart/Mole, Dryness, Hives, Jaundice, New Lesions, Non-Healing Wounds, Rash and Ulcer. HEENT Not Present- Earache, Hearing Loss, Hoarseness, Nose Bleed, Oral Ulcers, Ringing in the Ears, Seasonal Allergies, Sinus Pain, Sore Throat, Visual Disturbances, Wears glasses/contact lenses and Yellow Eyes. Respiratory Not Present- Bloody sputum, Chronic Cough, Difficulty Breathing, Snoring and Wheezing. Breast Not Present- Breast Mass, Breast Pain, Nipple Discharge and Skin  Changes. Cardiovascular Not Present- Chest Pain, Difficulty Breathing Lying Down, Leg Cramps, Palpitations, Rapid Heart Rate, Shortness of Breath and Swelling of Extremities. Gastrointestinal Present- Hemorrhoids. Not Present- Abdominal  Pain, Bloating, Bloody Stool, Change in Bowel Habits, Chronic diarrhea, Constipation, Difficulty Swallowing, Excessive gas, Gets full quickly at meals, Indigestion, Nausea, Rectal Pain and Vomiting. Male Genitourinary Not Present- Blood in Urine, Change in Urinary Stream, Frequency, Impotence, Nocturia, Painful Urination, Urgency and Urine Leakage. Musculoskeletal Present- Back Pain and Joint Pain. Not Present- Joint Stiffness, Muscle Pain, Muscle Weakness and Swelling of Extremities. Neurological Not Present- Decreased Memory, Fainting, Headaches, Numbness, Seizures, Tingling, Tremor, Trouble walking and Weakness. Psychiatric Present- Anxiety and Depression. Not Present- Bipolar, Change in Sleep Pattern, Fearful and Frequent crying. Endocrine Not Present- Cold Intolerance, Excessive Hunger, Hair Changes, Heat Intolerance, Hot flashes and New Diabetes. Hematology Not Present- Blood Thinners, Easy Bruising, Excessive bleeding, Gland problems, HIV and Persistent Infections.  Vitals (Xavier A. Xavier Campbell RMA; 07/16/2016 9:42 AM) 07/16/2016 9:41 AM Weight: 158.6 lb Height: 72in Body Surface Area: 1.93 m Body Mass Index: 21.51 kg/m  Temp.: 96.13F  Pulse: 102 (Regular)  BP: 110/80 (Sitting, Left Arm, Standard)      Physical Exam Adin Hector MD; 07/16/2016 10:04 AM)  General Mental Status-Alert. General Appearance-Not in acute distress, Not Sickly. Orientation-Oriented X3. Hydration-Well hydrated. Voice-Normal.  Integumentary Global Assessment Upon inspection and palpation of skin surfaces of the - Axillae: non-tender, no inflammation or ulceration, no drainage. and Distribution of scalp and body hair is normal. General  Characteristics Temperature - normal warmth is noted.  Head and Neck Head-normocephalic, atraumatic with no lesions or palpable masses. Face Global Assessment - atraumatic, no absence of expression. Neck Global Assessment - no abnormal movements, no bruit auscultated on the right, no bruit auscultated on the left, no decreased range of motion, non-tender. Trachea-midline. Thyroid Gland Characteristics - non-tender.  Eye Eyeball - Left-Extraocular movements intact, No Nystagmus. Eyeball - Right-Extraocular movements intact, No Nystagmus. Cornea - Left-No Hazy. Cornea - Right-No Hazy. Sclera/Conjunctiva - Left-No scleral icterus, No Discharge. Sclera/Conjunctiva - Right-No scleral icterus, No Discharge. Pupil - Left-Direct reaction to light normal. Pupil - Right-Direct reaction to light normal.  ENMT Ears Pinna - Left - no drainage observed, no generalized tenderness observed. Right - no drainage observed, no generalized tenderness observed. Nose and Sinuses External Inspection of the Nose - no destructive lesion observed. Inspection of the nares - Left - quiet respiration. Right - quiet respiration. Mouth and Throat Lips - Upper Lip - no fissures observed, no pallor noted. Lower Lip - no fissures observed, no pallor noted. Nasopharynx - no discharge present. Oral Cavity/Oropharynx - Tongue - no dryness observed. Oral Mucosa - no cyanosis observed. Hypopharynx - no evidence of airway distress observed.  Chest and Lung Exam Inspection Movements - Normal and Symmetrical. Accessory muscles - No use of accessory muscles in breathing. Palpation Palpation of the chest reveals - Non-tender. Auscultation Breath sounds - Normal and Clear.  Cardiovascular Auscultation Rhythm - Regular. Murmurs & Other Heart Sounds - Auscultation of the heart reveals - No Murmurs and No Systolic Clicks.  Abdomen Inspection Inspection of the abdomen reveals - No Visible peristalsis  and No Abnormal pulsations. Umbilicus - No Bleeding, No Urine drainage. Palpation/Percussion Palpation and Percussion of the abdomen reveal - Soft, Non Tender, No Rebound tenderness, No Rigidity (guarding) and No Cutaneous hyperesthesia.  Male Genitourinary Sexual Maturity Tanner 5 - Adult hair pattern and Adult penile size and shape. Note: Bilateral groin incisions consistent with prior hernia repairs. No inguinal hernias. Normal external genitalia. Epididymi, testes, and spermatic cords normal without any masses.  Rectal Note: Left lateral greater than right posterior external hemorrhoids. Right  posterior internal hemorrhoid was swelling in hypertrophic changes. Easily prolapses out. Partially reducible. Left lateral internal grade 2 hemorrhoid. Right anterior grade 2 internal hemorrhoid.  Perianal skin clean with good hygiene. No pruritis ani. No pilonidal disease. No fissure. No abscess/fistula. Normal sphincter tone. No condyloma warts. Tolerates digital and anoscopic rectal exam. No other rectal masses. Exam done with assistance of male Medical Assistant in the room.  Peripheral Vascular Upper Extremity Inspection - Left - No Cyanotic nailbeds, Not Ischemic. Right - No Cyanotic nailbeds, Not Ischemic.  Neurologic Neurologic evaluation reveals -normal attention span and ability to concentrate, able to name objects and repeat phrases. Appropriate fund of knowledge , normal sensation and normal coordination. Mental Status Affect - not angry, not paranoid. Cranial Nerves-Normal Bilaterally. Gait-Normal.  Neuropsychiatric Mental status exam performed with findings of-able to articulate well with normal speech/language, rate, volume and coherence, thought content normal with ability to perform basic computations and apply abstract reasoning and no evidence of hallucinations, delusions, obsessions or homicidal/suicidal ideation.  Musculoskeletal Global Assessment Spine, Ribs  and Pelvis - no instability, subluxation or laxity. Right Upper Extremity - no instability, subluxation or laxity.  Lymphatic Head & Neck  General Head & Neck Lymphatics: Bilateral - Description - No Localized lymphadenopathy. Axillary  General Axillary Region: Bilateral - Description - No Localized lymphadenopathy. Femoral & Inguinal  Generalized Femoral & Inguinal Lymphatics: Left - Description - No Localized lymphadenopathy. Right - Description - No Localized lymphadenopathy.   Results Adin Hector MD; 07/16/2016 10:07 AM) Procedures  Name Value Date Hemorrhoids Procedure Anal exam: External Hemorrhoid Internal exam: Internal Hemorroids ( non-bleeding) prolapse Mass Other: Left lateral greater than right posterior external hemorrhoids. Right posterior internal hemorrhoid was swelling in hypertrophic changes. Easily prolapses out. Partially reducible. Left lateral internal grade 2 hemorrhoid. Right anterior grade 2 internal hemorrhoid. Perianal skin clean with good hygiene. No pruritis ani. No pilonidal disease. No fissure. No abscess/fistula. Normal sphincter tone.  No condyloma warts. Tolerates digital and anoscopic rectal exam. No other rectal masses. Exam done with assistance of male Medical Assistant in the room.  Performed: 07/16/2016 10:04 AM    Assessment & Plan Adin Hector MD; 07/16/2016 10:07 AM)  PROLAPSED INTERNAL HEMORRHOIDS, GRADE 3 (K64.2) Impression: Chronically prolapsing right posterior hemorrhoid with some hyperpigmentation and atypical features features. Not classic for condyloma or an anal canal polyp. With external hemorrhoids. 2 sensitive and thickened for banding or injection.  Think this requires surgical removal the patient agrees. We'll work to do an outpatient surgery.  Current Plans ANOSCOPY, DIAGNOSTIC (83382) Pt Education - [facility] Hemorrhoids (Sawyer Kahan): discussed with patient and provided information. EXTERNAL  HEMORRHOIDS WITH COMPLICATION (N05.3)  ENCOUNTER FOR PREOPERATIVE EXAMINATION FOR GENERAL SURGICAL PROCEDURE (Z01.818)  Current Plans You are being scheduled for surgery- Our schedulers will call you.  You should hear from our office's scheduling department within 5 working days about the location, date, and time of surgery. We try to make accommodations for patient's preferences in scheduling surgery, but sometimes the OR schedule or the surgeon's schedule prevents [place] from making those accommodations.  If you have not heard from our office [phone]) in 5 working days, call the office and ask for your surgeon's nurse.  If you have other questions about your diagnosis, plan, or surgery, call the office and ask for your surgeon's nurse.  The anatomy and the physiology was discussed. The pathophysiology and natural history of the disease was discussed. Options were discussed and recommendations were made. Technique, risks, benefits, &  alternatives were discussed. Risks such as stroke, heart attack, bleeding, indection, death, and other risks discussed. Questions answered. The patient agrees to proceed. Pt Education - [facility] Rectal Prep for Anorectal outpatient/office surgery: discussed with patient and provided information. Pt Education - [facility] Rectal Surgery HCI (Adysen Raphael): discussed with patient and provided information. TOBACCO ABUSE (Z72.0)  Current Plans Pt Education - [facility] STOP SMOKING!  Adin Hector, M.D., F.A.C.S. Gastrointestinal and Minimally Invasive Surgery [facility] [address] [place], [place] [address] [phone] Main / Paging

## 2016-07-26 ENCOUNTER — Encounter (HOSPITAL_BASED_OUTPATIENT_CLINIC_OR_DEPARTMENT_OTHER): Payer: Self-pay | Admitting: *Deleted

## 2016-07-30 ENCOUNTER — Encounter (HOSPITAL_BASED_OUTPATIENT_CLINIC_OR_DEPARTMENT_OTHER): Payer: Self-pay | Admitting: *Deleted

## 2016-07-30 NOTE — Progress Notes (Signed)
NPO AFTER MN.  ARRIVE AT 0730.  NEEDS ISTAT 8.  CURRENT EKG IN CHART AND EPIC.  PT VERBALIZED UNDERSTANDING RECTAL PREP INSTRUCTIONS GIVEN TO HIM AT DR GROSS OFFICE.  PT VERBALIZED UNDERSTANDING TO DO HIBICLENS SHOWER HS BEFORE AND AM DOS.  WILL TAKE PRILOSEC AM DOS W/ SIPS OF WATER AND IF NEEDED TAKE HYDROCODONE.

## 2016-08-09 ENCOUNTER — Ambulatory Visit (HOSPITAL_BASED_OUTPATIENT_CLINIC_OR_DEPARTMENT_OTHER): Payer: Medicaid Other | Admitting: Anesthesiology

## 2016-08-09 ENCOUNTER — Encounter (HOSPITAL_BASED_OUTPATIENT_CLINIC_OR_DEPARTMENT_OTHER): Payer: Self-pay | Admitting: Certified Registered"

## 2016-08-09 ENCOUNTER — Encounter (HOSPITAL_BASED_OUTPATIENT_CLINIC_OR_DEPARTMENT_OTHER)
Admission: RE | Disposition: A | Discharge status: Home / Self Care | Payer: Self-pay | Source: Ambulatory Visit | Attending: Surgery

## 2016-08-09 ENCOUNTER — Ambulatory Visit (HOSPITAL_BASED_OUTPATIENT_CLINIC_OR_DEPARTMENT_OTHER)
Admission: RE | Admit: 2016-08-09 | Discharge: 2016-08-09 | Disposition: A | Discharge status: Home / Self Care | Payer: Medicaid Other | Source: Ambulatory Visit | Attending: Surgery | Admitting: Surgery

## 2016-08-09 DIAGNOSIS — K620 Anal polyp: Secondary | ICD-10-CM

## 2016-08-09 DIAGNOSIS — Z791 Long term (current) use of non-steroidal anti-inflammatories (NSAID): Secondary | ICD-10-CM | POA: Insufficient documentation

## 2016-08-09 DIAGNOSIS — K644 Residual hemorrhoidal skin tags: Secondary | ICD-10-CM | POA: Diagnosis not present

## 2016-08-09 DIAGNOSIS — K642 Third degree hemorrhoids: Secondary | ICD-10-CM | POA: Insufficient documentation

## 2016-08-09 DIAGNOSIS — F329 Major depressive disorder, single episode, unspecified: Secondary | ICD-10-CM | POA: Diagnosis not present

## 2016-08-09 DIAGNOSIS — F17210 Nicotine dependence, cigarettes, uncomplicated: Secondary | ICD-10-CM | POA: Diagnosis not present

## 2016-08-09 DIAGNOSIS — I10 Essential (primary) hypertension: Secondary | ICD-10-CM | POA: Diagnosis not present

## 2016-08-09 DIAGNOSIS — F419 Anxiety disorder, unspecified: Secondary | ICD-10-CM | POA: Diagnosis not present

## 2016-08-09 DIAGNOSIS — Z79899 Other long term (current) drug therapy: Secondary | ICD-10-CM | POA: Insufficient documentation

## 2016-08-09 DIAGNOSIS — K629 Disease of anus and rectum, unspecified: Secondary | ICD-10-CM | POA: Insufficient documentation

## 2016-08-09 HISTORY — DX: Complete loss of teeth, unspecified cause, unspecified class: Z97.2

## 2016-08-09 HISTORY — DX: Benign prostatic hyperplasia without lower urinary tract symptoms: N40.0

## 2016-08-09 HISTORY — DX: Complete loss of teeth, unspecified cause, unspecified class: K08.109

## 2016-08-09 HISTORY — DX: Third degree hemorrhoids: K64.2

## 2016-08-09 HISTORY — DX: Dorsalgia, unspecified: M54.9

## 2016-08-09 HISTORY — DX: Alcohol dependence, in remission: F10.21

## 2016-08-09 HISTORY — DX: Other seasonal allergic rhinitis: J30.2

## 2016-08-09 HISTORY — DX: Hyperlipidemia, unspecified: E78.5

## 2016-08-09 HISTORY — DX: Personal history of colonic polyps: Z86.010

## 2016-08-09 HISTORY — DX: Other psychoactive substance abuse, in remission: F19.11

## 2016-08-09 HISTORY — DX: Other chronic pain: G89.29

## 2016-08-09 HISTORY — DX: Personal history of adenomatous and serrated colon polyps: Z86.0101

## 2016-08-09 HISTORY — DX: Personal history of other diseases of the nervous system and sense organs: Z86.69

## 2016-08-09 HISTORY — DX: Unspecified glaucoma: H40.9

## 2016-08-09 HISTORY — DX: Sickle-cell trait: D57.3

## 2016-08-09 HISTORY — DX: Family history of diseases of the blood and blood-forming organs and certain disorders involving the immune mechanism: Z83.2

## 2016-08-09 HISTORY — DX: Personal history of (healed) traumatic fracture: Z87.81

## 2016-08-09 HISTORY — DX: Personal history of other diseases of the nervous system and sense organs: Z92.29

## 2016-08-09 HISTORY — DX: Male erectile dysfunction, unspecified: N52.9

## 2016-08-09 HISTORY — DX: Iron deficiency anemia, unspecified: D50.9

## 2016-08-09 HISTORY — DX: Personal history of other diseases of the digestive system: Z87.19

## 2016-08-09 HISTORY — PX: HEMORRHOID SURGERY: SHX153

## 2016-08-09 HISTORY — DX: Other cervical disc degeneration, unspecified cervical region: M50.30

## 2016-08-09 LAB — POCT I-STAT, CHEM 8
BUN: 6 mg/dL (ref 6–20)
Calcium, Ion: 1.25 mmol/L (ref 1.15–1.40)
Chloride: 104 mmol/L (ref 101–111)
Creatinine, Ser: 0.7 mg/dL (ref 0.61–1.24)
Glucose, Bld: 103 mg/dL — ABNORMAL HIGH (ref 65–99)
HCT: 33 % — ABNORMAL LOW (ref 39.0–52.0)
Hemoglobin: 11.2 g/dL — ABNORMAL LOW (ref 13.0–17.0)
Potassium: 3.5 mmol/L (ref 3.5–5.1)
Sodium: 142 mmol/L (ref 135–145)
TCO2: 24 mmol/L (ref 0–100)

## 2016-08-09 SURGERY — HEMORRHOIDECTOMY
Anesthesia: General

## 2016-08-09 MED ORDER — CEFAZOLIN SODIUM-DEXTROSE 2-4 GM/100ML-% IV SOLN
2.0000 g | INTRAVENOUS | Status: AC
  Administered 2016-08-09: 2 g via INTRAVENOUS
  Filled 2016-08-09: qty 100

## 2016-08-09 MED ORDER — FENTANYL CITRATE (PF) 100 MCG/2ML IJ SOLN
INTRAMUSCULAR | Status: AC
  Filled 2016-08-09: qty 2

## 2016-08-09 MED ORDER — ACETAMINOPHEN 500 MG PO TABS
1000.0000 mg | ORAL_TABLET | ORAL | Status: AC
  Administered 2016-08-09: 1000 mg via ORAL
  Filled 2016-08-09: qty 2

## 2016-08-09 MED ORDER — CEFAZOLIN SODIUM-DEXTROSE 2-4 GM/100ML-% IV SOLN
INTRAVENOUS | Status: AC
  Filled 2016-08-09: qty 100

## 2016-08-09 MED ORDER — ROCURONIUM BROMIDE 50 MG/5ML IV SOSY
PREFILLED_SYRINGE | INTRAVENOUS | Status: AC
  Filled 2016-08-09: qty 5

## 2016-08-09 MED ORDER — SUCCINYLCHOLINE CHLORIDE 200 MG/10ML IV SOSY
PREFILLED_SYRINGE | INTRAVENOUS | Status: AC
  Filled 2016-08-09: qty 10

## 2016-08-09 MED ORDER — LIDOCAINE 2% (20 MG/ML) 5 ML SYRINGE
INTRAMUSCULAR | Status: DC | PRN
  Administered 2016-08-09: 40 mg via INTRAVENOUS

## 2016-08-09 MED ORDER — CHLORHEXIDINE GLUCONATE CLOTH 2 % EX PADS
6.0000 | MEDICATED_PAD | Freq: Once | CUTANEOUS | Status: DC
  Filled 2016-08-09: qty 6

## 2016-08-09 MED ORDER — DEXAMETHASONE SODIUM PHOSPHATE 10 MG/ML IJ SOLN
INTRAMUSCULAR | Status: AC
  Filled 2016-08-09: qty 1

## 2016-08-09 MED ORDER — SUCCINYLCHOLINE CHLORIDE 200 MG/10ML IV SOSY
PREFILLED_SYRINGE | INTRAVENOUS | Status: DC | PRN
  Administered 2016-08-09: 120 mg via INTRAVENOUS

## 2016-08-09 MED ORDER — GABAPENTIN 300 MG PO CAPS
300.0000 mg | ORAL_CAPSULE | ORAL | Status: AC
  Administered 2016-08-09: 300 mg via ORAL
  Filled 2016-08-09: qty 1

## 2016-08-09 MED ORDER — MIDAZOLAM HCL 5 MG/5ML IJ SOLN
INTRAMUSCULAR | Status: DC | PRN
  Administered 2016-08-09: 2 mg via INTRAVENOUS

## 2016-08-09 MED ORDER — LIDOCAINE 2% (20 MG/ML) 5 ML SYRINGE
INTRAMUSCULAR | Status: AC
  Filled 2016-08-09: qty 5

## 2016-08-09 MED ORDER — NAPROXEN 500 MG PO TABS: 500.0000 mg | ORAL_TABLET | Freq: Two times a day (BID) | ORAL | 1 refills | Status: DC

## 2016-08-09 MED ORDER — METRONIDAZOLE IN NACL 5-0.79 MG/ML-% IV SOLN
500.0000 mg | INTRAVENOUS | Status: DC
  Filled 2016-08-09: qty 100

## 2016-08-09 MED ORDER — ONDANSETRON HCL 4 MG/2ML IJ SOLN
INTRAMUSCULAR | Status: AC
  Filled 2016-08-09: qty 2

## 2016-08-09 MED ORDER — SUGAMMADEX SODIUM 200 MG/2ML IV SOLN
INTRAVENOUS | Status: DC | PRN
  Administered 2016-08-09: 200 mg via INTRAVENOUS

## 2016-08-09 MED ORDER — OXYCODONE HCL 5 MG/5ML PO SOLN
5.0000 mg | Freq: Once | ORAL | Status: DC | PRN
  Filled 2016-08-09: qty 5

## 2016-08-09 MED ORDER — BUPIVACAINE-EPINEPHRINE 0.25% -1:200000 IJ SOLN
INTRAMUSCULAR | Status: DC | PRN
  Administered 2016-08-09: 30 mL

## 2016-08-09 MED ORDER — OXYCODONE HCL 5 MG PO TABS: 5.0000 mg | ORAL_TABLET | ORAL | 0 refills | Status: DC | PRN

## 2016-08-09 MED ORDER — GABAPENTIN 300 MG PO CAPS
ORAL_CAPSULE | ORAL | Status: AC
  Filled 2016-08-09: qty 1

## 2016-08-09 MED ORDER — METRONIDAZOLE IN NACL 5-0.79 MG/ML-% IV SOLN
500.0000 mg | INTRAVENOUS | Status: AC
  Administered 2016-08-09: 500 mg via INTRAVENOUS
  Filled 2016-08-09 (×2): qty 100

## 2016-08-09 MED ORDER — PROPOFOL 10 MG/ML IV BOLUS
INTRAVENOUS | Status: AC
  Filled 2016-08-09: qty 20

## 2016-08-09 MED ORDER — SUGAMMADEX SODIUM 200 MG/2ML IV SOLN
INTRAVENOUS | Status: AC
  Filled 2016-08-09: qty 2

## 2016-08-09 MED ORDER — MIDAZOLAM HCL 2 MG/2ML IJ SOLN
INTRAMUSCULAR | Status: AC
  Filled 2016-08-09: qty 2

## 2016-08-09 MED ORDER — CELECOXIB 200 MG PO CAPS
ORAL_CAPSULE | ORAL | Status: AC
  Filled 2016-08-09: qty 2

## 2016-08-09 MED ORDER — BUPIVACAINE LIPOSOME 1.3 % IJ SUSP
INTRAMUSCULAR | Status: DC | PRN
  Administered 2016-08-09: 20 mL

## 2016-08-09 MED ORDER — OXYCODONE HCL 5 MG PO TABS
5.0000 mg | ORAL_TABLET | Freq: Once | ORAL | Status: DC | PRN
  Filled 2016-08-09: qty 1

## 2016-08-09 MED ORDER — HYDROCORTISONE 2.5 % RE CREA: 1.0000 "application " | TOPICAL_CREAM | Freq: Two times a day (BID) | RECTAL | 2 refills | Status: DC

## 2016-08-09 MED ORDER — ONDANSETRON HCL 4 MG/2ML IJ SOLN
4.0000 mg | Freq: Once | INTRAMUSCULAR | Status: DC | PRN
  Filled 2016-08-09: qty 2

## 2016-08-09 MED ORDER — ACETAMINOPHEN 500 MG PO TABS
ORAL_TABLET | ORAL | Status: AC
  Filled 2016-08-09: qty 2

## 2016-08-09 MED ORDER — FENTANYL CITRATE (PF) 100 MCG/2ML IJ SOLN
25.0000 ug | INTRAMUSCULAR | Status: DC | PRN
  Administered 2016-08-09 (×2): 50 ug via INTRAVENOUS
  Filled 2016-08-09: qty 1

## 2016-08-09 MED ORDER — DEXAMETHASONE SODIUM PHOSPHATE 4 MG/ML IJ SOLN
INTRAMUSCULAR | Status: DC | PRN
  Administered 2016-08-09: 10 mg via INTRAVENOUS

## 2016-08-09 MED ORDER — PROPOFOL 10 MG/ML IV BOLUS
INTRAVENOUS | Status: DC | PRN
  Administered 2016-08-09: 130 mg via INTRAVENOUS

## 2016-08-09 MED ORDER — FENTANYL CITRATE (PF) 100 MCG/2ML IJ SOLN
INTRAMUSCULAR | Status: DC | PRN
  Administered 2016-08-09: 50 ug via INTRAVENOUS
  Administered 2016-08-09 (×2): 25 ug via INTRAVENOUS

## 2016-08-09 MED ORDER — ONDANSETRON HCL 4 MG/2ML IJ SOLN
INTRAMUSCULAR | Status: DC | PRN
  Administered 2016-08-09: 4 mg via INTRAVENOUS

## 2016-08-09 MED ORDER — LACTATED RINGERS IV SOLN
INTRAVENOUS | Status: DC
  Administered 2016-08-09 (×2): via INTRAVENOUS
  Filled 2016-08-09: qty 1000

## 2016-08-09 MED ORDER — BUPIVACAINE LIPOSOME 1.3 % IJ SUSP
20.0000 mL | INTRAMUSCULAR | Status: DC
  Filled 2016-08-09: qty 20

## 2016-08-09 MED ORDER — CELECOXIB 400 MG PO CAPS
400.0000 mg | ORAL_CAPSULE | ORAL | Status: AC
  Administered 2016-08-09: 400 mg via ORAL
  Filled 2016-08-09: qty 1

## 2016-08-09 SURGICAL SUPPLY — 50 items
APL SKNCLS STERI-STRIP NONHPOA (GAUZE/BANDAGES/DRESSINGS) ×1
BENZOIN TINCTURE PRP APPL 2/3 (GAUZE/BANDAGES/DRESSINGS) ×2 IMPLANT
BLADE CLIPPER SURG (BLADE) IMPLANT
BLADE HEX COATED 2.75 (ELECTRODE) ×2 IMPLANT
BLADE SURG 10 STRL SS (BLADE) IMPLANT
BLADE SURG 15 STRL LF DISP TIS (BLADE) ×1 IMPLANT
BLADE SURG 15 STRL SS (BLADE) ×2
BNDG GAUZE ELAST 4 BULKY (GAUZE/BANDAGES/DRESSINGS) ×2 IMPLANT
CANISTER SUCTION 1200CC (MISCELLANEOUS) ×2 IMPLANT
COVER BACK TABLE 60X90IN (DRAPES) ×2 IMPLANT
COVER MAYO STAND STRL (DRAPES) ×2 IMPLANT
DRAPE LAPAROTOMY 100X72 PEDS (DRAPES) ×2 IMPLANT
DRAPE LG THREE QUARTER DISP (DRAPES) IMPLANT
DRSG PAD ABDOMINAL 8X10 ST (GAUZE/BANDAGES/DRESSINGS) IMPLANT
ELECT NDL TIP 2.8 STRL (NEEDLE) IMPLANT
ELECT NEEDLE TIP 2.8 STRL (NEEDLE) IMPLANT
ELECT REM PT RETURN 9FT ADLT (ELECTROSURGICAL) ×2
ELECTRODE REM PT RTRN 9FT ADLT (ELECTROSURGICAL) ×1 IMPLANT
GLOVE ECLIPSE 8.0 STRL XLNG CF (GLOVE) ×2 IMPLANT
GLOVE INDICATOR 8.0 STRL GRN (GLOVE) ×2 IMPLANT
GOWN STRL REUS W/ TWL LRG LVL3 (GOWN DISPOSABLE) ×1 IMPLANT
GOWN STRL REUS W/TWL LRG LVL3 (GOWN DISPOSABLE) ×2
KIT RM TURNOVER CYSTO AR (KITS) ×2 IMPLANT
LEGGING LITHOTOMY PAIR STRL (DRAPES) IMPLANT
NDL SAFETY ECLIPSE 18X1.5 (NEEDLE) ×1 IMPLANT
NEEDLE HYPO 18GX1.5 SHARP (NEEDLE) ×2
NEEDLE HYPO 22GX1.5 SAFETY (NEEDLE) ×2 IMPLANT
PACK BASIN DAY SURGERY FS (CUSTOM PROCEDURE TRAY) ×2 IMPLANT
PAD ABD 8X10 STRL (GAUZE/BANDAGES/DRESSINGS) ×2 IMPLANT
PAD PREP 24X48 CUFFED NSTRL (MISCELLANEOUS) IMPLANT
PENCIL BUTTON HOLSTER BLD 10FT (ELECTRODE) ×2 IMPLANT
SHEARS HARMONIC 9CM CVD (BLADE) IMPLANT
SPONGE SURGIFOAM ABS GEL 100 (HEMOSTASIS) IMPLANT
SPONGE SURGIFOAM ABS GEL 12-7 (HEMOSTASIS) IMPLANT
SURGILUBE 2OZ TUBE FLIPTOP (MISCELLANEOUS) ×2 IMPLANT
SUT CHROMIC 2 0 SH (SUTURE) ×4 IMPLANT
SUT CHROMIC 3 0 SH 27 (SUTURE) IMPLANT
SUT VIC AB 2-0 SH 27 (SUTURE)
SUT VIC AB 2-0 SH 27X BRD (SUTURE) IMPLANT
SUT VIC AB 2-0 UR6 27 (SUTURE) IMPLANT
SUT VICRYL 0 UR6 27IN ABS (SUTURE) ×6 IMPLANT
SYR 20CC LL (SYRINGE) ×2 IMPLANT
SYR BULB IRRIGATION 50ML (SYRINGE) ×2 IMPLANT
TAPE HYPAFIX 4 X10 (GAUZE/BANDAGES/DRESSINGS) IMPLANT
TOWEL OR 17X24 6PK STRL BLUE (TOWEL DISPOSABLE) ×4 IMPLANT
TRAY DSU PREP LF (CUSTOM PROCEDURE TRAY) ×2 IMPLANT
TUBE CONNECTING 12X1/4 (SUCTIONS) IMPLANT
UNDERPAD 30X30 INCONTINENT (UNDERPADS AND DIAPERS) ×2 IMPLANT
WATER STERILE IRR 500ML POUR (IV SOLUTION) ×2 IMPLANT
YANKAUER SUCT BULB TIP NO VENT (SUCTIONS) ×2 IMPLANT

## 2016-08-09 NOTE — Anesthesia Procedure Notes (Signed)
Procedure Name: Intubation Date/Time: 08/09/2016 9:26 AM Performed by: Denna Haggard D Pre-anesthesia Checklist: Patient identified, Emergency Drugs available, Suction available and Patient being monitored Patient Re-evaluated:Patient Re-evaluated prior to induction Oxygen Delivery Method: Circle system utilized Preoxygenation: Pre-oxygenation with 100% oxygen Induction Type: IV induction Ventilation: Mask ventilation without difficulty Laryngoscope Size: Mac and 3 Grade View: Grade I Tube type: Oral Tube size: 7.5 mm Number of attempts: 1 Airway Equipment and Method: Stylet and Oral airway Placement Confirmation: ETT inserted through vocal cords under direct vision,  positive ETCO2 and breath sounds checked- equal and bilateral Secured at: 21 cm Tube secured with: Tape Dental Injury: Teeth and Oropharynx as per pre-operative assessment

## 2016-08-09 NOTE — Discharge Instructions (Signed)
ANORECTAL SURGERY:  POST OPERATIVE INSTRUCTIONS  ######################################################################  EAT Gradually transition to a high fiber diet with a fiber supplement over the next few weeks after discharge.  Start with a pureed / full liquid diet (see below)  WALK Walk an hour a day.  Control your pain to do that.    CONTROL PAIN Control pain so that you can walk, sleep, tolerate sneezing/coughing, go up/down stairs.  HAVE A BOWEL MOVEMENT DAILY Keep your bowels regular to avoid problems.  OK to try a laxative to override constipation.  OK to use an antidairrheal to slow down diarrhea.  Call if not better after 2 tries  CALL IF YOU HAVE PROBLEMS/CONCERNS Call if you are still struggling despite following these instructions. Call if you have concerns not answered by these instructions  ######################################################################    1. Take your usually prescribed home medications unless otherwise directed. 2. DIET: Follow a light bland diet the first 24 hours after arrival home, such as soup, liquids, crackers, etc.  Be sure to include lots of fluids daily.  Avoid fast food or heavy meals as your are more likely to get nauseated.  Eat a low fat the next few days after surgery.   3. PAIN CONTROL: a. Pain is best controlled by a usual combination of three different methods TOGETHER: i. Ice/Heat ii. Over the counter pain medication iii. Prescription pain medication b. Expect swelling and discomfort in the anus/rectal area.  Warm water baths (30-60 minutes up to 6 times a day, especially after bowel meovements) will help. Use ice for the first few days to help decrease swelling and bruising, then switch to heat such as warm towels, sitz baths, warm baths, etc to help relax tight/sore spots and speed recovery.  Some people prefer to use ice alone, heat alone, alternating between ice & heat.  Experiment to what works for you.   c. It is  helpful to take an over-the-counter pain medication regularly for the first few weeks.  Choose one of the following that works best for you: i. Naproxen (Aleve, etc)  Two 221m tabs twice a day ii. Ibuprofen (Advil, etc) Three 20102mtabs four times a day (every meal & bedtime) iii. Acetaminophen (Tylenol, etc) 500-65042mour times a day (every meal & bedtime) d. A  prescription for pain medication (such as oxycodone, hydrocodone, etc) should be given to you upon discharge.  Take your pain medication as prescribed.  i. If you are having problems/concerns with the prescription medicine (does not control pain, nausea, vomiting, rash, itching, etc), please call us [phone] see if we need to switch you to a different pain medicine that will work better for you and/or control your side effect better. ii. If you need a refill on your pain medication, please contact your pharmacy.  They will contact our office to request authorization. Prescriptions will not be filled after 5 pm or on week-ends.  Use a Sitz Bath 4-8 times a day for relief   [employer] bath is a warm water bath taken in the sitting position that covers only the hips and buttocks. It may be used for either healing or hygiene purposes. Sitz baths are also used to relieve pain, itching, or muscle spasms. The water may contain medicine. Moist heat will help you heal and relax.  HOME CARE INSTRUCTIONS  Take 3 to 4 sitz baths a day. 1. Fill the bathtub half full with warm water. 2. Sit in the water and open the  drain a little. 3. Turn on the warm water to keep the tub half full. Keep the water running constantly. 4. Soak in the water for 15 to 20 minutes. 5. After the sitz bath, pat the affected area dry first.   4. KEEP YOUR BOWELS REGULAR a. The goal is one bowel movement a day b. Avoid getting constipated.  Between the surgery and the pain medications, it is common to experience some constipation.  Increasing fluid intake  and taking a fiber supplement (such as Metamucil, Citrucel, FiberCon, MiraLax, etc) 2-3 times a day regularly will usually help prevent this problem from occurring.  A mild laxative (prune juice, Milk of Magnesia, MiraLax, etc) should be taken according to package directions if there are no bowel movements after 48 hours. c. Watch out for diarrhea.  If you have many loose bowel movements, simplify your diet to bland foods & liquids for a few days.  Stop any stool softeners and decrease your fiber supplement.  Switching to mild anti-diarrheal medications (Kayopectate, Pepto Bismol) can help.  If this worsens or does not improve, please call us.  5. Wound Care  a. Remove your bandages with your first bowel movement, usually the day after surgery.  Let all packing come out.   b. Wear an absorbent pad or soft cotton balls in your underwear as needed to catch any drainage and help keep the area  c. Keep the area clean and dry.  Bathe / shower every day.  Keep the area clean by showering / bathing over the incision / wound.   It is okay to soak an open wound to help wash it.  Wet wipes or showers / gentle washing after bowel movements is often less traumatic than regular toilet paper. d. Dennis Bast will often notice bleeding with bowel movements.  This should slow down by the end of the first week of surgery.  Sitting on an ice pack can help. e. Expect some drainage.  This should slow down by the end of the first week of surgery, but you will have occasional bleeding or drainage up to a few months after surgery.  Wear an absorbent pad or soft cotton gauze in your underwear until the drainage stops.  6. ACTIVITIES as tolerated:   a. You may resume regular (light) daily activities beginning the next day--such as daily self-care, walking, climbing stairs--gradually increasing activities as tolerated.  If you can walk 30 minutes without difficulty, it is safe to try more intense activity such as jogging, treadmill,  bicycling, low-impact aerobics, swimming, etc. b. Save the most intensive and strenuous activity for last such as sit-ups, heavy lifting, contact sports, etc  Refrain from any heavy lifting or straining until you are off narcotics for pain control.   c. DO NOT PUSH THROUGH PAIN.  Let pain be your guide: If it hurts to do something, don't do it.  Pain is your body warning you to avoid that activity for another week until the pain goes down. d. You may drive when you are no longer taking prescription pain medication, you can comfortably sit for long periods of time, and you can safely maneuver your car and apply brakes. e. Dennis Bast may have sexual intercourse when it is comfortable.  7. FOLLOW UP in our office a. Please call [facility] at [phone] to set up an appointment to see your surgeon in the office for a follow-up appointment approximately 2 weeks after your surgery. b. Make sure that you call for this appointment  the day you arrive home to insure a convenient appointment time. 10. IF YOU HAVE DISABILITY OR FAMILY LEAVE FORMS, BRING THEM TO THE OFFICE FOR PROCESSING.  DO NOT GIVE THEM TO YOUR DOCTOR.        WHEN TO CALL us [phone]: 1. Poor pain control 2. Reactions / problems with new medications (rash/itching, nausea, etc)  3. Fever over 101.5 F (38.5 C) 4. Inability to urinate 5. Nausea and/or vomiting 6. Worsening swelling or bruising 7. Continued bleeding from incision. 8. Increased pain, redness, or drainage from the incision  The clinic staff is available to answer your questions during regular business hours (8:30am-5pm).  Please dont hesitate to call and ask to speak to one of our nurses for clinical concerns.   A surgeon from [facility] is always on call at the hospitals   If you have a medical emergency, go to the nearest emergency room or call 911.    [facility], [place], [address], [place], [place]  [address] ? MAIN: [phone]  [phone] ? TOLL FREE: [phone] ? FAX [phone] [email or web address]   HEMORRHOIDS  The rectum is the last foot of your colon, and it naturally stretches to hold stool.  Hemorrhoidal piles are natural clusters of blood vessels that help the rectum and anal canal stretch to hold stool and allow bowel movements to eliminate feces.   Hemorrhoids are abnormally swollen blood vessels in the rectum.  Too much pressure in the rectum causes hemorrhoids by forcing blood to stretch and bulge the walls of the veins, sometimes even rupturing them.  Hemorrhoids can become like varicose veins you might see on a person's legs.  Most people will develop a flare of hemorrhoids in their lifetime.  When bulging hemorrhoidal veins are irritated, they can swell, burn, itch, cause pain, and bleed.  Most flares will calm down gradually own within a few weeks.  However, once hemorrhoids are created, they are difficult to get rid of completely and tend to flare more easily than the first flare.   Fortunately, good habits and simple medical treatment usually control hemorrhoids well, and surgery is needed only in severe cases. Types of Hemorrhoids:  Internal hemorrhoids usually don't initially hurt or itch; they are deep inside the rectum and usually have no sensation. If they begin to push out (prolapse), pain and burning can occur.  However, internal hemorrhoids can bleed.  Anal bleeding should not be ignored since bleeding could come from a dangerous source like colorectal cancer, so persistent rectal bleeding should be investigated by a doctor, sometimes with a colonoscopy.  External hemorrhoids cause most of the symptoms - pain, burning, and itching. Nonirritated hemorrhoids can look like small skin tags coming out of the anus.   Thrombosed hemorrhoids can form when a hemorrhoid blood vessel bursts and causes the hemorrhoid to suddenly swell.  A purple blood clot can form in it and become an  excruciatingly painful lump at the anus. Because of these unpleasant symptoms, immediate incision and drainage by a surgeon at an office visit can provide much relief of the pain.    PREVENTION Avoiding the most frequent causes listed below will prevent most cases of hemorrhoids: Constipation Hard stools Diarrhea  Constant sitting  Straining with bowel movements Sitting on the toilet for a long time  Severe coughing  episodes Pregnancy / Childbirth  Heavy Lifting  Sometimes avoiding the above triggers is difficult:  How can you avoid sitting all day if  you have a seated job? Also, we try to avoid coughing and diarrhea, but sometimes its beyond your control.  Still, there are some practical hints to help: Keep the anal and genital area clean.  Moistened tissues such as flushable wet wipes are less irritating than toilet paper.  Using irrigating showers or bottle irrigation washing gently cleans this sensitive area.   Avoid dry toilet paper when cleaning after bowel movements.  Marland Kitchen Keep the anal and genital area dry.  Lightly pat the rectal area dry.  Avoid rubbing.  Talcum or baby powders can help GET YOUR STOOLS SOFT.   This is the most important way to prevent irritated hemorrhoids.  Hard stools are like sandpaper to the anorectal canal and will cause more problems.  The goal: ONE SOFT BOWEL MOVEMENT A DAY!  BMs from every other day to 3 times a day is a tolerable range Treat coughing, diarrhea and constipation early since irritated hemorrhoids may soon follow.  If your main job activity is seated, always stand or walk during your breaks. Make it a point to stand and walk at least 5 minutes every hour and try to shift frequently in your chair to avoid direct rectal pressure.  Always exhale as you strain or lift. Don't hold your breath.  Do not delay or try to prevent a bowel movement when the urge is present. Exercise regularly (walking or jogging 60 minutes a day) to stimulate the bowels to  move. No reading or other activity while on the toilet. If bowel movements take longer than 5 minutes, you are too constipated. AVOID CONSTIPATION Drink plenty of liquids (1 1/2 to 2 quarts of water and other fluids a day unless fluid restricted for another medical condition). Liquids that contain caffeine (coffee a, tea, soft drinks) can be dehydrating and should be avoided until constipation is controlled. Consider minimizing milk, as dairy products may be constipating. Eat plenty of fiber (30g a day ideal, more if needed).  Fiber is the undigested part of plant food that passes into the colon, acting as natures broom to encourage bowel motility and movement.  Fiber can absorb and hold large amounts of water. This results in a larger, bulkier stool, which is soft and easier to pass.  Eating foods high in fiber - 12 servings - such as  Vegetables: Root (potatoes, carrots, turnips), Leafy green (lettuce, salad greens, celery, spinach), High residue (cabbage, broccoli, etc.) Fruit: Fresh, Dried (prunes, apricots, cherries), Stewed (applesauce)  Whole grain breads, pasta, whole wheat Bran cereals, muffins, etc. Consider adding supplemental bulking fiber which retains large volumes of water: Psyllium ground seeds (native plant from [place])--available as Metamucil, Konsyl, Effersyllium, Per Diem Fiber, or the less expensive generic forms.  Citrucel  (methylcellulose wood fiber) . FiberCon (Polycarbophil) Polyethylene Glycol - and artificial fiber commonly called Miralax or Glycolax.  It is helpful for people with gassy or bloated feelings with regular fiber Flax Seed - a less gassy natural fiber  Laxatives can be useful for a short period if constipation is severe Osmotics (Milk of Magnesia, Fleets Phospho-Soda, Magnesium Citrate)  Stimulants (Senokot,   Castor Oil,  Dulcolax, Ex-Lax)    Laxatives are not a good long-term solution as it can stress the bowels and cause too much mineral loss  and dehydration.   Avoid taking laxatives for more than 7 days in a row.  AVOID DIARRHEA Switch to liquids and simpler foods for a few days to avoid stressing your intestines further. Avoid dairy products (  especially milk & ice cream) for a short time.  The intestines often can lose the ability to digest lactose when stressed. Avoid foods that cause gassiness or bloating.  Typical foods include beans and other legumes, cabbage, broccoli, and dairy foods.  Every person has some sensitivity to other foods, so listen to your body and avoid those foods that trigger problems for you. Adding fiber (Citrucel, Metamucil, FiberCon, Flax seed, Miralax) gradually can help thicken stools by absorbing excess fluid and retrain the intestines to act more normally.  Slowly increase the dose over a few weeks.  Too much fiber too soon can backfire and cause cramping & bloating. Probiotics (such as active yogurt, Align, etc) may help repopulate the intestines and colon with normal bacteria and calm down a sensitive digestive tract.  Most studies show it to be of mild help, though, and such products can be costly. Medicines: Bismuth subsalicylate (ex. Kayopectate, Pepto Bismol) every 30 minutes for up to 6 doses can help control diarrhea.  Avoid if pregnant. Loperamide (Immodium) can slow down diarrhea.  Start with two tablets (4mg  total) first and then try one tablet every 6 hours.  Avoid if you are having fevers or severe pain.  If you are not better or start feeling worse, stop all medicines and call your doctor for advice Call your doctor if you are getting worse or not better.  Sometimes further testing (cultures, endoscopy, X-ray studies, bloodwork, etc) may be needed to help diagnose and treat the cause of the diarrhea.  TROUBLESHOOTING IRREGULAR BOWELS 1) Avoid extremes of bowel movements (no bad constipation/diarrhea) 2) Miralax 17gm mixed in 8oz. water or juice-daily. May use BID as needed.  3) Gas-x,Phazyme,  etc. as needed for gas & bloating.  4) Soft,bland diet. No spicy,greasy,fried foods.  5) Prilosec over-the-counter as needed  6) May hold gluten/wheat products from diet to see if symptoms improve.  7)  May try probiotics (Align, Activa, etc) to help calm the bowels down 7) If symptoms become worse call back immediately.   TREATMENT OF HEMORRHOID FLARE If these preventive measures fail, you must take action right away! Hemorrhoids are one condition that can be mild in the morning and become intolerable by nightfall. Most hemorrhoidal flares take several weeks to calm down.  These suggestions can help: Warm soaks.  This helps more than any topical medication.  Use up to 8 times a day.  Usually sitz baths or sitting in a warm bathtub helps.  Sitting on moist warm towels are helpful.  Switching to ice packs/cool compresses can be helpful  Use a Sitz Bath 4-8 times a day for relief A sitz bath is a warm water bath taken in the sitting position that covers only the hips and buttocks. It may be used for either healing or hygiene purposes. Sitz baths are also used to relieve pain, itching, or muscle spasms. The water may contain medicine. Moist heat will help you heal and relax.  HOME CARE INSTRUCTIONS  Take 3 to 4 sitz baths a day. 6. Fill the bathtub half full with warm water. 7. Sit in the water and open the drain a little. 8. Turn on the warm water to keep the tub half full. Keep the water running constantly. 9. Soak in the water for 15 to 20 minutes. 10. After the sitz bath, pat the affected area dry first. SEEK MEDICAL CARE IF:  You get worse instead of better. Stop the sitz baths if you get worse.  Normalize your  bowels.  Extremes of diarrhea or constipation will make hemorrhoids worse.  One soft bowel movement a day is the goal.  Fiber can help get your bowels regular Wet wipes instead of toilet paper Pain control with a NSAID such as ibuprofen (Advil) or naproxen (Aleve) or acetaminophen  (Tylenol) around the clock.  Narcotics are constipating and should be minimized if possible Topical creams contain steroids (bydrocortisone) or local anesthetic (xylocaine) can help make pain and itching more tolerable.   EVALUATION If hemorrhoids are still causing problems, you could benefit by an evaluation by a surgeon.  The surgeon will obtain a history and examine you.  If hemorrhoids are diagnosed, some therapies can be offered in the office, usually with an anoscope into the less sensitive area of the rectum: -injection of hemorrhoids (sclerotherapy) can scar the blood vessels of the swollen/enlarged hemorrhoids to help shrink them down to a more normal size -rubber banding of the enlarged hemorrhoids to help shrink them down to a more normal size -drainage of the blood clot causing a thrombosed hemorrhoid,  to relieve the severe pain   While 90% of the time such problems from hemorrhoids can be managed without preceding to surgery, sometimes the hemorrhoids require a operation to control the problem (uncontrolled bleeding, prolapse, pain, etc.).   This involves being placed under general anesthesia where the surgeon can confirm the diagnosis and remove, suture, or staple the hemorrhoid(s).  Your surgeon can help you treat the problem appropriately.    Post Anesthesia Home Care Instructions  Activity: Get plenty of rest for the remainder of the day. A responsible individual must stay with you for 24 hours following the procedure.  For the next 24 hours, DO NOT: -Drive a car -Paediatric nurse -Drink alcoholic beverages -Take any medication unless instructed by your physician -Make any legal decisions or sign important papers.  Meals: Start with liquid foods such as gelatin or soup. Progress to regular foods as tolerated. Avoid greasy, spicy, heavy foods. If nausea and/or vomiting occur, drink only clear liquids until the nausea and/or vomiting subsides. Call your physician if vomiting  continues.  Special Instructions/Symptoms: Your throat may feel dry or sore from the anesthesia or the breathing tube placed in your throat during surgery. If this causes discomfort, gargle with warm salt water. The discomfort should disappear within 24 hours.  If you had a scopolamine patch placed behind your ear for the management of post- operative nausea and/or vomiting:  1. The medication in the patch is effective for 72 hours, after which it should be removed.  Wrap patch in a tissue and discard in the trash. Wash hands thoroughly with soap and water. 2. You may remove the patch earlier than 72 hours if you experience unpleasant side effects which may include dry mouth, dizziness or visual disturbances. 3. Avoid touching the patch. Wash your hands with soap and water after contact with the patch.

## 2016-08-09 NOTE — H&P (View-Only) (Signed)
Xavier Campbell 07/16/2016 9:40 AM Location: [facility] Patient #: [id number] DOB: 27-Dec-1955 Single / Language: Xavier Campbell / Race: Black or African American Male  History of Present Illness Xavier Hector MD; 07/16/2016 10:12 AM) The patient is a 61 year old male who presents with hemorrhoids. Note for "Hemorrhoids": ` ` ` Patient sent for surgical consultation at the request of Dr. Loletha Carrow  Chief Complaint: Prolapsing anal mass.  The patient is a pleasant male. Smokes a couple packs of cigarettes a week. He struggle with hemorrhoid problems for at least the past year. It's been frustrating for him. He will get a lot of pain and swelling. Now they are persistently popping out. Past push back in. He moves his bowels about 3 times a day. Occasionally uses stool softeners but on a fiber supplement. His first screening colonoscopy was normal. A more recent one did have some polyps. He is due for three-year follow-up. The did note a mass in the anal canal. More consistent with an anal canal polyp. Surgical consultation requested for probable removal since the centimeter most symptomatic hemorrhoid.   No personal nor family history of GI/colon cancer, inflammatory bowel disease, irritable bowel syndrome, allergy such as Celiac Sprue, dietary/dairy problems, colitis, ulcers nor gastritis. No recent sick contacts/gastroenteritis. No travel outside the country. No changes in diet. No dysphagia to solids or liquids. No significant heartburn or reflux. No hematochezia, hematemesis, coffee ground emesis. No evidence of prior gastric/peptic ulceration.  (Review of systems as stated in this history (HPI) or in the review of systems. Otherwise all other 12 point ROS are negative)   Past Surgical History ([name], [place]; 07/16/2016 9:40 AM) Colon Polyp Removal - Colonoscopy Colon Removal - Partial  Diagnostic Studies History ([name], [place]; 07/16/2016 9:40  AM) Colonoscopy within last year  Allergies ([name], [place]; 07/16/2016 9:43 AM) No Known Drug Allergies 07/16/2016 Allergies Reconciled  Medication History ([name], [place]; 07/16/2016 9:48 AM) Amitriptyline HCl (100MG  Tablet, Oral) Active. Brimonidine Tartrate (0.1% Solution, Ophthalmic) Active. Escitalopram Oxalate (20MG  Tablet, Oral) Active. Lisinopril (10MG  Tablet, Oral) Active. Omeprazole (20MG  Capsule DR, Oral) Active. Simvastatin (10MG  Tablet, Oral) Active. Travoprost (BAK Free) (0.004% Solution, Ophthalmic) Active. Zolpidem Tartrate (10MG  Tablet, Oral) Active. Hydrocortisone (25MG /ML Suspension, Injection) Active. Combigan (0.2-0.5% Solution, Ophthalmic) Active. Flexeril (10MG  Tablet, Oral) Active. Flomax (0.4MG  Capsule, Oral) Active. Medications Reconciled  Social History ([name], [place]; 07/16/2016 9:40 AM) Alcohol use Occasional alcohol use. No drug use Tobacco use Current some day smoker.  Family History ([name], [place]; 07/16/2016 9:40 AM) Alcohol Abuse Father. Diabetes Mellitus Mother. Heart disease in male family member before age 21  Other Problems ([name], [place]; 07/16/2016 9:40 AM) Anxiety Disorder Back Pain Depression Hemorrhoids     Review of Systems ([name]; 07/16/2016 9:40 AM) General Present- Fatigue. Not Present- Appetite Loss, Chills, Fever, Night Sweats, Weight Gain and Weight Loss. Skin Not Present- Change in Wart/Mole, Dryness, Hives, Jaundice, New Lesions, Non-Healing Wounds, Rash and Ulcer. HEENT Not Present- Earache, Hearing Loss, Hoarseness, Nose Bleed, Oral Ulcers, Ringing in the Ears, Seasonal Allergies, Sinus Pain, Sore Throat, Visual Disturbances, Wears glasses/contact lenses and Yellow Eyes. Respiratory Not Present- Bloody sputum, Chronic Cough, Difficulty Breathing, Snoring and Wheezing. Breast Not Present- Breast Mass, Breast Pain, Nipple Discharge and Skin  Changes. Cardiovascular Not Present- Chest Pain, Difficulty Breathing Lying Down, Leg Cramps, Palpitations, Rapid Heart Rate, Shortness of Breath and Swelling of Extremities. Gastrointestinal Present- Hemorrhoids. Not Present- Abdominal  Pain, Bloating, Bloody Stool, Change in Bowel Habits, Chronic diarrhea, Constipation, Difficulty Swallowing, Excessive gas, Gets full quickly at meals, Indigestion, Nausea, Rectal Pain and Vomiting. Male Genitourinary Not Present- Blood in Urine, Change in Urinary Stream, Frequency, Impotence, Nocturia, Painful Urination, Urgency and Urine Leakage. Musculoskeletal Present- Back Pain and Joint Pain. Not Present- Joint Stiffness, Muscle Pain, Muscle Weakness and Swelling of Extremities. Neurological Not Present- Decreased Memory, Fainting, Headaches, Numbness, Seizures, Tingling, Tremor, Trouble walking and Weakness. Psychiatric Present- Anxiety and Depression. Not Present- Bipolar, Change in Sleep Pattern, Fearful and Frequent crying. Endocrine Not Present- Cold Intolerance, Excessive Hunger, Hair Changes, Heat Intolerance, Hot flashes and New Diabetes. Hematology Not Present- Blood Thinners, Easy Bruising, Excessive bleeding, Gland problems, HIV and Persistent Infections.  Vitals ([name]; 07/16/2016 9:42 AM) 07/16/2016 9:41 AM Weight: 158.6 lb Height: 72in Body Surface Area: 1.93 m Body Mass Index: 21.51 kg/m  Temp.: 96.57F  Pulse: 102 (Regular)  BP: 110/80 (Sitting, Left Arm, Standard)      Physical Exam Xavier Hector MD; 07/16/2016 10:04 AM)  General Mental Status-Alert. General Appearance-Not in acute distress, Not Sickly. Orientation-Oriented X3. Hydration-Well hydrated. Voice-Normal.  Integumentary Global Assessment Upon inspection and palpation of skin surfaces of the - Axillae: non-tender, no inflammation or ulceration, no drainage. and Distribution of scalp and body hair is normal. General  Characteristics Temperature - normal warmth is noted.  Head and Neck Head-normocephalic, atraumatic with no lesions or palpable masses. Face Global Assessment - atraumatic, no absence of expression. Neck Global Assessment - no abnormal movements, no bruit auscultated on the right, no bruit auscultated on the left, no decreased range of motion, non-tender. Trachea-midline. Thyroid Gland Characteristics - non-tender.  Eye Eyeball - Left-Extraocular movements intact, No Nystagmus. Eyeball - Right-Extraocular movements intact, No Nystagmus. Cornea - Left-No Hazy. Cornea - Right-No Hazy. Sclera/Conjunctiva - Left-No scleral icterus, No Discharge. Sclera/Conjunctiva - Right-No scleral icterus, No Discharge. Pupil - Left-Direct reaction to light normal. Pupil - Right-Direct reaction to light normal.  ENMT Ears Pinna - Left - no drainage observed, no generalized tenderness observed. Right - no drainage observed, no generalized tenderness observed. Nose and Sinuses External Inspection of the Nose - no destructive lesion observed. Inspection of the nares - Left - quiet respiration. Right - quiet respiration. Mouth and Throat Lips - Upper Lip - no fissures observed, no pallor noted. Lower Lip - no fissures observed, no pallor noted. Nasopharynx - no discharge present. Oral Cavity/Oropharynx - Tongue - no dryness observed. Oral Mucosa - no cyanosis observed. Hypopharynx - no evidence of airway distress observed.  Chest and Lung Exam Inspection Movements - Normal and Symmetrical. Accessory muscles - No use of accessory muscles in breathing. Palpation Palpation of the chest reveals - Non-tender. Auscultation Breath sounds - Normal and Clear.  Cardiovascular Auscultation Rhythm - Regular. Murmurs & Other Heart Sounds - Auscultation of the heart reveals - No Murmurs and No Systolic Clicks.  Abdomen Inspection Inspection of the abdomen reveals - No Visible peristalsis  and No Abnormal pulsations. Umbilicus - No Bleeding, No Urine drainage. Palpation/Percussion Palpation and Percussion of the abdomen reveal - Soft, Non Tender, No Rebound tenderness, No Rigidity (guarding) and No Cutaneous hyperesthesia.  Male Genitourinary Sexual Maturity Tanner 5 - Adult hair pattern and Adult penile size and shape. Note: Bilateral groin incisions consistent with prior hernia repairs. No inguinal hernias. Normal external genitalia. Epididymi, testes, and spermatic cords normal without any masses.  Rectal Note: Left lateral greater than right posterior external hemorrhoids. Right  posterior internal hemorrhoid was swelling in hypertrophic changes. Easily prolapses out. Partially reducible. Left lateral internal grade 2 hemorrhoid. Right anterior grade 2 internal hemorrhoid.  Perianal skin clean with good hygiene. No pruritis ani. No pilonidal disease. No fissure. No abscess/fistula. Normal sphincter tone. No condyloma warts. Tolerates digital and anoscopic rectal exam. No other rectal masses. Exam done with assistance of male Medical Assistant in the room.  Peripheral Vascular Upper Extremity Inspection - Left - No Cyanotic nailbeds, Not Ischemic. Right - No Cyanotic nailbeds, Not Ischemic.  Neurologic Neurologic evaluation reveals -normal attention span and ability to concentrate, able to name objects and repeat phrases. Appropriate fund of knowledge , normal sensation and normal coordination. Mental Status Affect - not angry, not paranoid. Cranial Nerves-Normal Bilaterally. Gait-Normal.  Neuropsychiatric Mental status exam performed with findings of-able to articulate well with normal speech/language, rate, volume and coherence, thought content normal with ability to perform basic computations and apply abstract reasoning and no evidence of hallucinations, delusions, obsessions or homicidal/suicidal ideation.  Musculoskeletal Global Assessment Spine, Ribs  and Pelvis - no instability, subluxation or laxity. Right Upper Extremity - no instability, subluxation or laxity.  Lymphatic Head & Neck  General Head & Neck Lymphatics: Bilateral - Description - No Localized lymphadenopathy. Axillary  General Axillary Region: Bilateral - Description - No Localized lymphadenopathy. Femoral & Inguinal  Generalized Femoral & Inguinal Lymphatics: Left - Description - No Localized lymphadenopathy. Right - Description - No Localized lymphadenopathy.   Results Xavier Hector MD; 07/16/2016 10:07 AM) Procedures  Name Value Date Hemorrhoids Procedure Anal exam: External Hemorrhoid Internal exam: Internal Hemorroids ( non-bleeding) prolapse Mass Other: Left lateral greater than right posterior external hemorrhoids. Right posterior internal hemorrhoid was swelling in hypertrophic changes. Easily prolapses out. Partially reducible. Left lateral internal grade 2 hemorrhoid. Right anterior grade 2 internal hemorrhoid. Perianal skin clean with good hygiene. No pruritis ani. No pilonidal disease. No fissure. No abscess/fistula. Normal sphincter tone.  No condyloma warts. Tolerates digital and anoscopic rectal exam. No other rectal masses. Exam done with assistance of male Medical Assistant in the room.  Performed: 07/16/2016 10:04 AM    Assessment & Plan Xavier Hector MD; 07/16/2016 10:07 AM)  PROLAPSED INTERNAL HEMORRHOIDS, GRADE 3 (K64.2) Impression: Chronically prolapsing right posterior hemorrhoid with some hyperpigmentation and atypical features features. Not classic for condyloma or an anal canal polyp. With external hemorrhoids. 2 sensitive and thickened for banding or injection.  Think this requires surgical removal the patient agrees. We'll work to do an outpatient surgery.  Current Plans ANOSCOPY, DIAGNOSTIC (29562) Pt Education - [facility] Hemorrhoids (Peter Keyworth): discussed with patient and provided information. EXTERNAL  HEMORRHOIDS WITH COMPLICATION (Z30.8)  ENCOUNTER FOR PREOPERATIVE EXAMINATION FOR GENERAL SURGICAL PROCEDURE (Z01.818)  Current Plans You are being scheduled for surgery- Our schedulers will call you.  You should hear from our office's scheduling department within 5 working days about the location, date, and time of surgery. We try to make accommodations for patient's preferences in scheduling surgery, but sometimes the OR schedule or the surgeon's schedule prevents [place] from making those accommodations.  If you have not heard from our office [phone]) in 5 working days, call the office and ask for your surgeon's nurse.  If you have other questions about your diagnosis, plan, or surgery, call the office and ask for your surgeon's nurse.  The anatomy and the physiology was discussed. The pathophysiology and natural history of the disease was discussed. Options were discussed and recommendations were made. Technique, risks, benefits, &  alternatives were discussed. Risks such as stroke, heart attack, bleeding, indection, death, and other risks discussed. Questions answered. The patient agrees to proceed. Pt Education - [facility] Rectal Prep for Anorectal outpatient/office surgery: discussed with patient and provided information. Pt Education - [facility] Rectal Surgery HCI (Laurenashley Viar): discussed with patient and provided information. TOBACCO ABUSE (Z72.0)  Current Plans Pt Education - [facility] STOP SMOKING!  Xavier Campbell, M.D., F.A.C.S. Gastrointestinal and Minimally Invasive Surgery [facility] [address] [place], [place] [address] [phone] Main / Paging

## 2016-08-09 NOTE — Anesthesia Postprocedure Evaluation (Signed)
Anesthesia Post Note  Patient: Xavier Campbell  Procedure(s) Performed: Procedure(s) (LRB): HEMORRHOIDECTOMY x 2, HEMORRHOIDAL LIGATION/PEXY, ANORECTAL EXAMINATION UNDER ANESTHESIA , excision of anal canal mass (N/A)     Patient location during evaluation: PACU Anesthesia Type: General Level of consciousness: awake, awake and alert and oriented Pain management: pain level controlled Vital Signs Assessment: post-procedure vital signs reviewed and stable Respiratory status: spontaneous breathing, nonlabored ventilation and respiratory function stable Cardiovascular status: blood pressure returned to baseline    Last Vitals:  Vitals:   08/09/16 1100 08/09/16 1115  BP: (!) 148/96 137/90  Pulse: 63 61  Resp: 19 14  Temp:      Last Pain:  Vitals:   08/09/16 1145  TempSrc:   PainSc: 0-No pain                 Evelynn Hench COKER

## 2016-08-09 NOTE — Anesthesia Preprocedure Evaluation (Signed)
Anesthesia Evaluation  Patient identified by MRN, date of birth, ID band Patient awake    Reviewed: Allergy & Precautions, NPO status , Patient's Chart, lab work & pertinent test results  Airway Mallampati: II  TM Distance: >3 FB     Dental  (+) Dental Advisory Given   Pulmonary Current Smoker,    breath sounds clear to auscultation       Cardiovascular hypertension,  Rhythm:Regular Rate:Normal     Neuro/Psych    GI/Hepatic   Endo/Other    Renal/GU      Musculoskeletal   Abdominal   Peds  Hematology   Anesthesia Other Findings   Reproductive/Obstetrics                             Anesthesia Physical Anesthesia Plan  ASA: II  Anesthesia Plan: General   Post-op Pain Management:    Induction: Intravenous  PONV Risk Score and Plan: Ondansetron and Dexamethasone  Airway Management Planned: Oral ETT  Additional Equipment:   Intra-op Plan:   Post-operative Plan: Extubation in OR  Informed Consent: I have reviewed the patients History and Physical, chart, labs and discussed the procedure including the risks, benefits and alternatives for the proposed anesthesia with the patient or authorized representative who has indicated his/her understanding and acceptance.   Dental advisory given  Plan Discussed with: CRNA and Anesthesiologist  Anesthesia Plan Comments:         Anesthesia Quick Evaluation

## 2016-08-09 NOTE — Transfer of Care (Signed)
Immediate Anesthesia Transfer of Care Note  Patient: Xavier Campbell  Procedure(s) Performed: Procedure(s) (LRB): HEMORRHOIDECTOMY x 2, HEMORRHOIDAL LIGATION/PEXY, ANORECTAL EXAMINATION UNDER ANESTHESIA , excision of anal canal mass (N/A)  Patient Location: PACU  Anesthesia Type: General  Level of Consciousness: awake, oriented, sedated and patient cooperative  Airway & Oxygen Therapy: Patient Spontanous Breathing and Patient connected to face mask oxygen  Post-op Assessment: Report given to PACU RN and Post -op Vital signs reviewed and stable  Post vital signs: Reviewed and stable  Complications: No apparent anesthesia complications Last Vitals:  Vitals:   08/09/16 0741 08/09/16 1029  BP: (!) 154/92 (!) (P) 167/96  Pulse: 73   Resp: 18 (!) (P) 23  Temp: 36.8 C (!) (P) 36.3 C    Last Pain:  Vitals:   08/09/16 0741  TempSrc: Oral      Patients Stated Pain Goal: 6 (08/09/16 0817)

## 2016-08-09 NOTE — Interval H&P Note (Signed)
History and Physical Interval Note:  08/09/2016 8:49 AM  Xavier Campbell  has presented today for surgery, with the diagnosis of Symptomatic hemorrhoids  The various methods of treatment have been discussed with the patient and family. After consideration of risks, benefits and other options for treatment, the patient has consented to  Procedure(s) with comments: HEMORRHOIDECTOMY, HEMORRHOIDAL LIGATION/PEXY, ANORECTAL EXAMINATION UNDER ANESTHESIA (N/A) - GENERAL AND LOCAL ANESTHESIA  as a surgical intervention .  The patient's history has been reviewed, patient examined, no change in status, stable for surgery.  I have reviewed the patient's chart and labs.  Questions were answered to the patient's satisfaction.     [name] C.

## 2016-08-09 NOTE — Op Note (Signed)
08/09/2016  10:33 AM  PATIENT:  Xavier Campbell  61 y.o. male  Patient Care Team: Javier Docker, MD as PCP - General (Internal Medicine) Michael Boston, MD as Consulting Physician (General Surgery) Danis, Kirke Corin, MD as Consulting Physician (Gastroenterology)  PRE-OPERATIVE DIAGNOSIS:     Symptomatic hemorrhoids  Anal canal polypoid mass  POST-OPERATIVE DIAGNOSIS:    Symptomatic hemorrhoids  Anal canal polypoid mass  PROCEDURE:   Excision of anal canal polypoid mass  EXTERNAL HEMORRHOIDECTOMY x 2,  INTERNAL HEMORRHOIDAL LIGATION/PEXY,  ANORECTAL EXAMINATION UNDER ANESTHESIA ,  SURGEON:  Adin Hector, MD  ANESTHESIA:   General Anorectal & Local field block  0.25% bupivacaine with epinephrine at the beginning of the case. Liposomal bupivacaine (Experel) at the end of the case.  EBL:  Total I/O In: 800 [I.V.:800] Out: 10 [Blood:10].  See operative record  Delay start of Pharmacological VTE agent (>24hrs) due to surgical blood loss or risk of bleeding:  NO  DRAINS: NONE  SPECIMEN:  Source of Specimen:  1.  Left lateral external hemorrhoid.  2.  Right posterior anal canal polypoid mass.  DISPOSITION OF SPECIMEN:  PATHOLOGY  COUNTS:  YES  PLAN OF CARE: Discharge home after PACU  PATIENT DISPOSITION:  PACU - hemodynamically stable.  INDICATION: Pleasant patient with struggles with hemorrhoids.  Not able to be managed in the office despite an improved bowel regimen.  Also with pedunculated mass in anal canal a little atypical for typical anal crypt polyp.  Gastro-J recommended surgical consultation.  I recommended examination under anesthesia and surgical treatment:  The anatomy & physiology of the anorectal region was discussed.  The pathophysiology of hemorrhoids and differential diagnosis was discussed.  Natural history risks without surgery was discussed.   I stressed the importance of a bowel regimen to have daily soft bowel movements to minimize  progression of disease.  Interventions such as sclerotherapy & banding were discussed.  The patient's symptoms are not adequately controlled by medicines and other non-operative treatments.  I feel the risks & problems of no surgery outweigh the operative risks; therefore, I recommended surgery to treat the hemorrhoids by ligation, pexy, and possible resection.  Risks such as bleeding, infection, need for further treatment, heart attack, death, and other risks were discussed.   I noted a good likelihood this will help address the problem.  Goals of post-operative recovery were discussed as well.  Possibility that this will not correct all symptoms was explained.  Post-operative pain, bleeding, constipation, urinary difficulties, and other problems after surgery were discussed.  We will work to minimize complications.   Educational handouts further explaining the pathology, treatment options, and bowel regimen were given as well.  Questions were answered.  The patient expresses understanding & wishes to proceed with surgery.  OR FINDINGS: Grade 2/3 internal hemorrhoids.  Left lateral external component with some irritation.  Very small right posterior external hemorrhoidal component.  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  1 cm fibrotic atypical tip.  Not classic for condyloma.  No fissure.  No fistula.  Prostate okay.  No proctitis.  DESCRIPTION:   Informed consent was confirmed. Patient underwent general anesthesia without difficulty. Patient was placed into prone positioning.  The perianal region was prepped and draped in sterile fashion. Surgical time-out confirmed our plan.  I did digital rectal examination and then transitioned over to anoscopy to get a sense of the anatomy.  Findings noted above.   I did hemorrhoidal ligation  and pexy in the classic hexagonal pattern (right anterior/lateral/posterior, left anterior/lateral/posterior).   I used a 2-0 Vicryl  suture on a UR-6 needle in a figure-of-eight fashion 6 cm proximal to the anal verge.  I did this at all six locations.  I did some additional resections.  I excised a pedunculated right posterior anal canal mass longitudinally in a fusiform biconcave fashion, sparing rectal wall & anoderm to avoid narrowing.  I then ran that stitch longitudinally more distally with tying every two throws to provide a good hemorrhoidal ligation and pexy.  I did the hemorrhoidal ligations and hemorrhoidectomy closure over a large Hill-Furgeson retarctor to avoid narrowing of the anal canal.   While doing these, I excised persistent external hemorrhoid and internal hemorrhoid prolapsing longitudinally at the Right anterior, right posterior and left lateral locations.  The base of those wounds were very narrow so I held off on any chromic suturing and left them open to granulate in.    I redid anoscopy examination.    At completion of this, all hemorrhoids had been removed or reduced into the rectum.  There is no prolapse.  External anatomy looked much more normal.  Hemostasis was good.  Fluffed gauze was on laid over the perianal region.  No packing done.  Patient is being extubated go to go to the recovery room.  I had discussed postop care in detail with the patient in the preop holding area.  Instructions for post-operative recovery and prescriptions are written. I discussed operative findings, updated the patient's status, discussed probable steps to recovery, and gave postoperative recommendations to the Patient's friend..  Recommendations were made.  Questions were answered.  He expressed understanding & appreciation.  Adin Hector, M.D., F.A.C.S. Gastrointestinal and Minimally Invasive Surgery [facility] [address] [place], [place] [address] [phone] Main / Paging

## 2016-08-10 ENCOUNTER — Encounter (HOSPITAL_BASED_OUTPATIENT_CLINIC_OR_DEPARTMENT_OTHER): Payer: Self-pay | Admitting: Surgery

## 2018-02-13 IMAGING — DX DG LUMBAR SPINE 2-3V
2 series · 2 of 2 positions shown · non-contrast
Comparison: Chest x-ray of September 24, 2013 for purposes of
numbering the vertebral levels. Comparison made to the CT myelogram
November 30, 2015.

CLINICAL DATA: Preoperative examination prior to lumbar surgical
procedure.

EXAM:
LUMBAR SPINE - 2-3 VIEW

[l-spine ap]
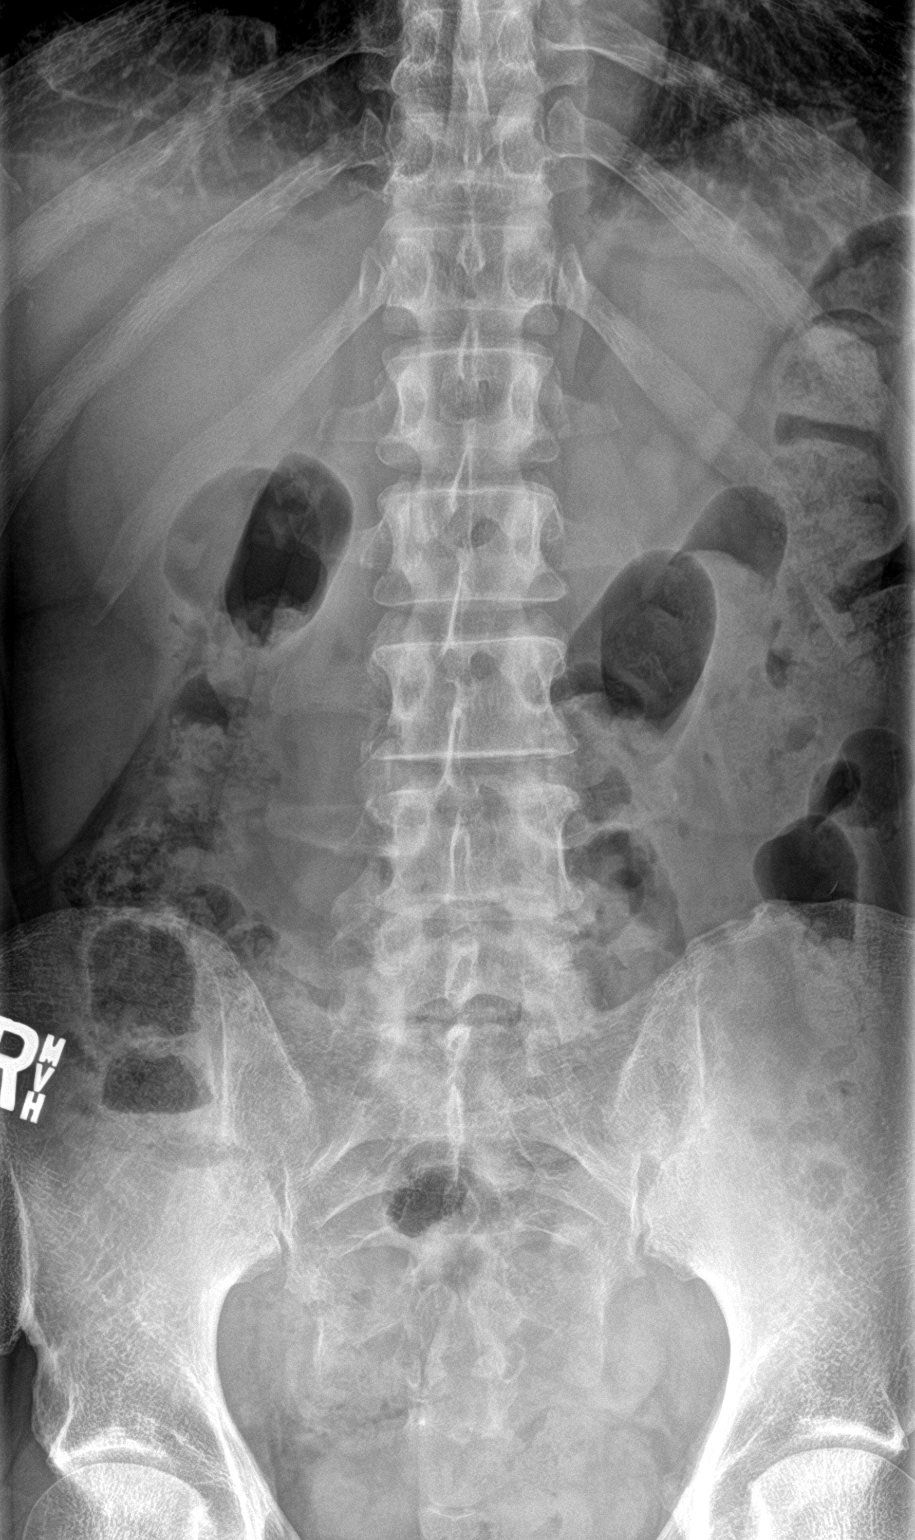

[l-spine spot]
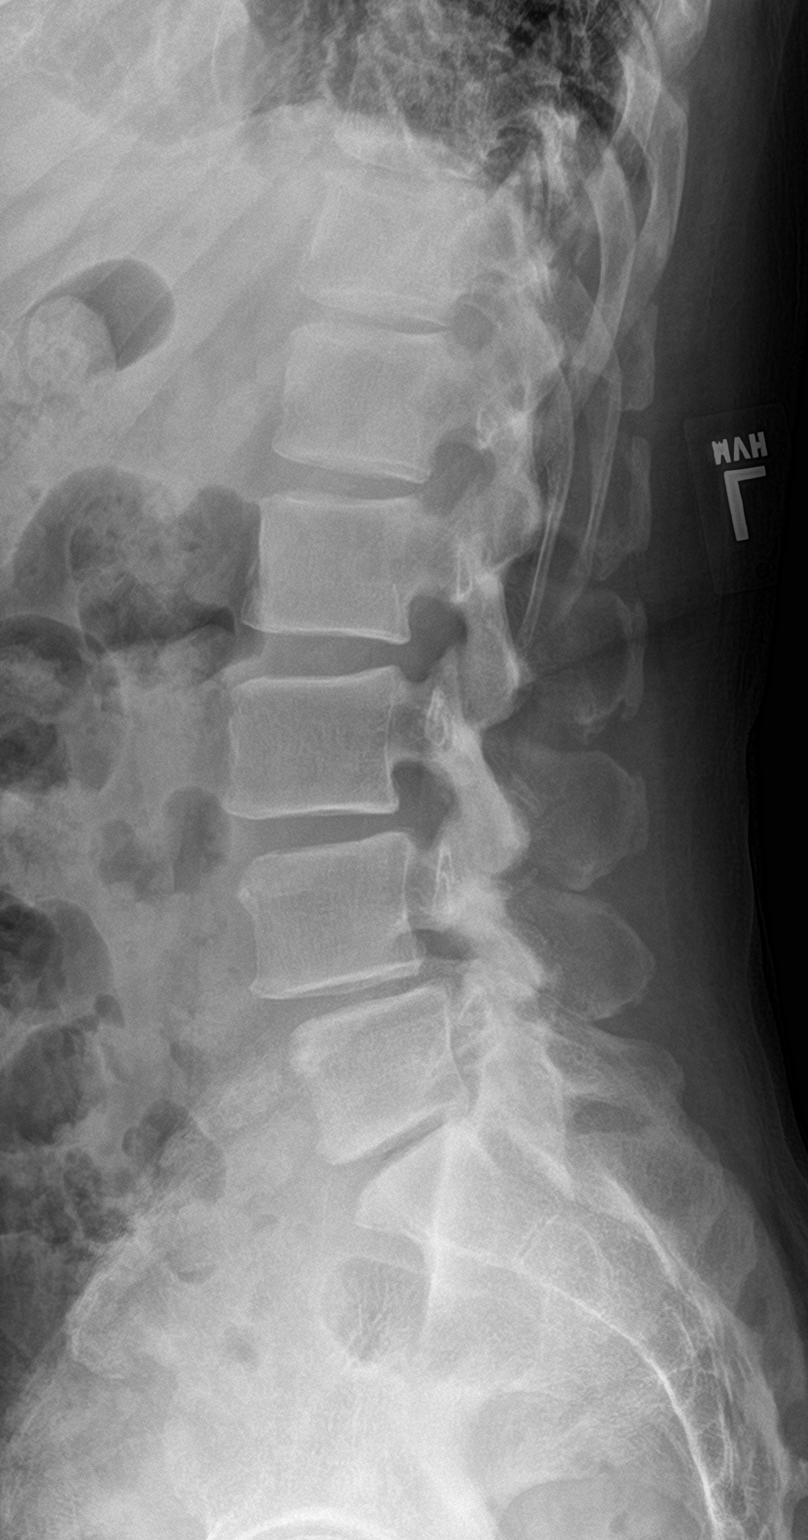

[2 of 2 positions shown; findings below may reference images not displayed]

FINDINGS: There are 5 lumbar type non-rib-bearing vertebral bodies. The
vertebral bodies are preserved in height. There is moderate disc
space narrowing at L4-5 with mild narrowing at L5-S1. There is grade
1 anterolisthesis at L4-5. There is facet joint hypertrophy at L4-5
and at L5-S1. The pedicles and transverse processes are intact. The
observed portions of the sacrum are normal.
IMPRESSION: Degenerative disc disease centered at L4-5 and L5-S1 with grade 1
anterolisthesis at L4-5. Facet joint hypertrophy is present at L4-5
and L5-S1. There is no compression fracture.

## 2018-02-20 IMAGING — DX DG SPINE 1V PORT
1 series · 1 of 1 positions shown · non-contrast
Comparison: Plain films 01/18/2016

CLINICAL DATA: Surgical level L3-4, L4-5.

EXAM:
PORTABLE SPINE - 1 VIEW

[l-spine lat]
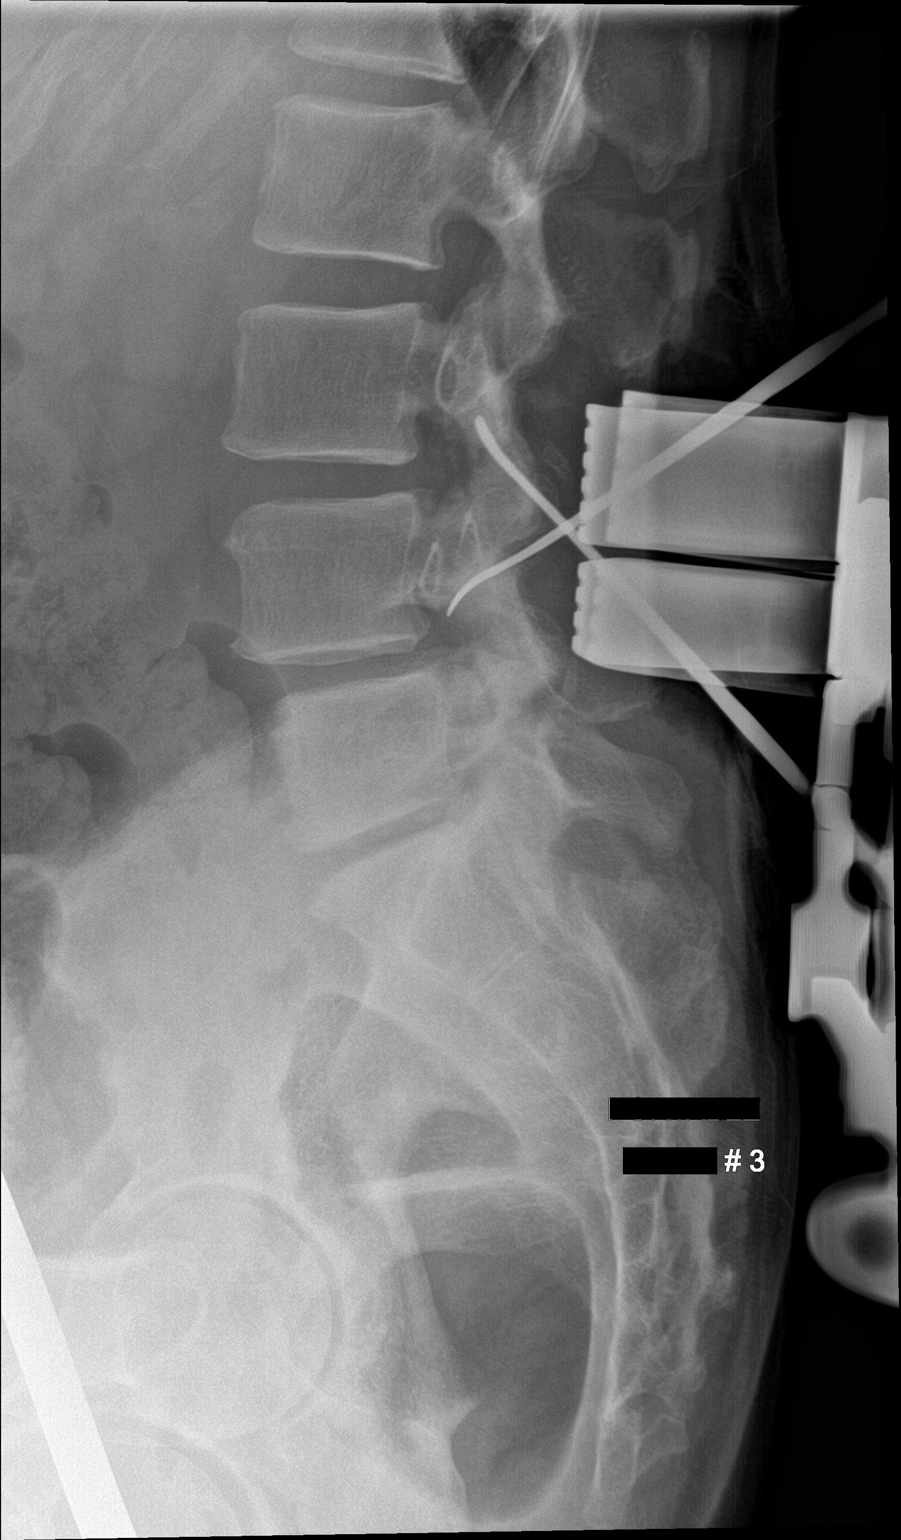

[1 of 1 positions shown; findings below may reference images not displayed]

FINDINGS: Posterior surgical instruments are directed at the L3 and L4
vertebral bodies.
IMPRESSION: Intraoperative localization as above.

## 2018-11-11 ENCOUNTER — Other Ambulatory Visit: Payer: Self-pay | Admitting: Nurse Practitioner

## 2018-11-11 DIAGNOSIS — M545 Low back pain, unspecified: Secondary | ICD-10-CM

## 2018-12-02 ENCOUNTER — Other Ambulatory Visit: Payer: Self-pay

## 2019-01-28 ENCOUNTER — Other Ambulatory Visit: Payer: Self-pay | Admitting: Nurse Practitioner

## 2019-01-28 ENCOUNTER — Ambulatory Visit
Admission: RE | Admit: 2019-01-28 | Discharge: 2019-01-28 | Disposition: A | Discharge status: Home / Self Care | Payer: Medicaid Other | Source: Ambulatory Visit | Attending: Nurse Practitioner | Admitting: Nurse Practitioner

## 2019-01-28 DIAGNOSIS — M545 Low back pain, unspecified: Secondary | ICD-10-CM

## 2019-10-01 ENCOUNTER — Encounter: Payer: Medicaid Other | Admitting: Infectious Diseases

## 2019-10-05 ENCOUNTER — Other Ambulatory Visit: Payer: Self-pay

## 2019-10-05 ENCOUNTER — Ambulatory Visit (INDEPENDENT_AMBULATORY_CARE_PROVIDER_SITE_OTHER): Payer: Medicare HMO | Admitting: Infectious Diseases

## 2019-10-05 ENCOUNTER — Telehealth: Payer: Self-pay

## 2019-10-05 ENCOUNTER — Encounter: Payer: Self-pay | Admitting: Infectious Diseases

## 2019-10-05 VITALS — BP 132/84 | HR 65 | Temp 98.5°F | Wt 156.0 lb

## 2019-10-05 DIAGNOSIS — B182 Chronic viral hepatitis C: Secondary | ICD-10-CM | POA: Diagnosis not present

## 2019-10-05 NOTE — Progress Notes (Signed)
[facility]                                      [address], [place], [place], [address]                                               Phn. [phone]; Fax: [phone]                                                               Date: 10/05/19  Reason for Visit: Hepatitis C    HPI: Xavier Campbell is a 64 y.o.old male with PMH as below who is here for evaluation of hepatitis C. Patient says he knew about his hepatitis C positive status after he was tested by his PCPi n May 2021. He never knew about having hepatitis C before it. He admits to using IV cocaine in the past approx 6-7 years ago when his mother died but says has been clean since then. He smokes cigarettes, uses 1 packet in every 3 days. He is trying to quit and is using a nicotine patch. Drinks alcohol occasionally. Denies sexual activity currently but sexually active with females in the past. Unaware about any partners with positive hepatitis C. Denies previous history of blood transfusions, sharing of toothbrushes/razors. Denies personal or family history of liver disease.  He has not received treatment to date. He lives in [place] and is retired. He does not have any acute complaints today.   ROS: Denies yellowish discoloration of sclera and skin, abdominal pain/distension, hematemesis.            Dcough, fever, chills, nightsweats, nausea, vomiting, diarrhea, constipation, weight loss, recent hospitalizations, rashes, joint complaints, shortness of breath, chest pain, headaches, dysuria .  Current Outpatient Medications on File Prior to Visit  Medication Sig Dispense Refill  . amitriptyline (ELAVIL) 100 MG tablet Take 100 mg by mouth at bedtime.    . brimonidine-timolol (COMBIGAN) 0.2-0.5 % ophthalmic solution Place 1 drop into both eyes 2 (two) times daily.     Marland Kitchen escitalopram (LEXAPRO) 20 MG tablet Take 20 mg by mouth every evening.     . ferrous  sulfate 325 (65 FE) MG tablet Take 650 mg by mouth daily with breakfast.    . HYDROcodone-acetaminophen (NORCO) 10-325 MG tablet Take 1 tablet by mouth every 4 (four) hours as needed.    . hydrocortisone (ANUSOL-HC) 2.5 % rectal cream Place 1 application rectally 2 (two) times daily. Apply around anus for irritated & painful hemorrhoids 15 g 2  . lidocaine (XYLOCAINE) 5 % ointment Use 2-3 times daily (Patient taking differently: Apply 1 application topically. Use 2-3 times daily as needed for hemorrhoid flare-up) 35.44 g 0  . lisinopril (PRINIVIL,ZESTRIL) 10 MG tablet Take 10 mg by mouth every morning.     . naproxen (NAPROSYN) 500 MG tablet Take 1 tablet (500 mg total) by mouth 2 (two) times daily with a meal. 40 tablet 1  . omeprazole (PRILOSEC) 20 MG capsule Take 20 mg by mouth every morning.     Marland Kitchen  oxyCODONE (OXY IR/ROXICODONE) 5 MG immediate release tablet Take 1-2 tablets (5-10 mg total) by mouth every 4 (four) hours as needed for moderate pain, severe pain or breakthrough pain. 40 tablet 0  . simvastatin (ZOCOR) 10 MG tablet Take 10 mg by mouth every evening.     . Travoprost, BAK Free, (TRAVATAN) 0.004 % SOLN ophthalmic solution Place 1 drop into both eyes at bedtime.     Marland Kitchen zolpidem (AMBIEN) 10 MG tablet Take 10 mg by mouth at bedtime.     No current facility-administered medications on file prior to visit.     No Known Allergies  Past Medical History:  Diagnosis Date  . Alcoholism in remission [facility])    per pt treatment 10/ 2009 and has been remission since  . BPH (benign prostatic hyperplasia)   . Chronic back pain   . DDD (degenerative disc disease), cervical   . ED (erectile dysfunction) of organic origin   . Family history of sickle cell anemia    2 sisters  . Full dentures   . GERD (gastroesophageal reflux disease)   . Glaucoma, both eyes   . Grade III internal hemorrhoids    symptomatic  . History of adenomatous polyp of colon    06-21-2016  tubular adenoma   . History  of drug abuse ([place])    per pt treatment 10/ 2009 (crack) in remission since  . History of esophageal stricture    s/p  dilation 04-10-2011  . History of facial fracture 07/2007   left side multiple facial fx's involving nose, eye, jaw, cheek  . 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 Hyperlipidemia   . Hypertension   . Iron deficiency anemia   . Osteoarthritis   . Seasonal allergies   . Sickle cell trait [facility])    Past Surgical History:  Procedure Laterality Date  . ANTERIOR CERVICAL DECOMP/DISCECTOMY FUSION  09/13/2011   Procedure: ANTERIOR CERVICAL DECOMPRESSION/DISCECTOMY FUSION 1 LEVEL;  Surgeon: Otilio Connors, MD;  Location: [facility];  Service: Neurosurgery;  Laterality: Bilateral;  Anterior Cervical Three-Four Decompression/Diskectomy and fusion  . ANTERIOR CERVICAL DECOMP/DISCECTOMY FUSION  05/23/2009   C4 -- C7  . APPENDECTOMY  age 36  . CATARACT EXTRACTION W/ INTRAOCULAR LENS  IMPLANT, BILATERAL  12/2015  . COLONOSCOPY  last one 06-21-2016  . ESOPHAGOGASTRODUODENOSCOPY  last one 04-10-2011  . HEMORRHOID SURGERY N/A 08/09/2016   Procedure: HEMORRHOIDECTOMY x 2, HEMORRHOIDAL LIGATION/PEXY, ANORECTAL EXAMINATION UNDER ANESTHESIA , excision of anal canal mass;  Surgeon: Michael Boston, MD;  Location: [facility];  Service: General;  Laterality: N/A;  GENERAL AND LOCAL ANESTHESIA   . LIPOMA EXCISION  10/29/2008   back   . LUMBAR LAMINECTOMY/DECOMPRESSION MICRODISCECTOMY Bilateral 01/25/2016   Procedure: Central decompression, lumbar laminectomy L3-L4 and L4-L5, and bilateral foraminotomy L4-L5;  Surgeon: Latanya Maudlin, MD;  Location: [facility];  Service: Orthopedics;  Laterality: Bilateral;  . ORBITAL FX REPAIR Left 2009   CLEARED FOR MRI.   Social History   Socioeconomic History  . Marital status: Single    Spouse name: Not on file  . Number of children: 2  . Years of education: Not on file  . Highest education level: Not  on file  Occupational History  . Occupation: Unemployed  Tobacco Use  . Smoking status: Current Every Day Smoker    Packs/day: 0.25    Years: 46.00    Pack years: 11.50    Types: Cigarettes  . Smokeless tobacco:  Never Used  . Tobacco comment: 1pp3d  Vaping Use  . Vaping Use: Never used  Substance and Sexual Activity  . Alcohol use: Yes    Comment: alcoholic -- treatment 37/ 2009 , per pt remission since  . Drug use: No    Comment: hx crack addict--  treatment 10/ 2009  per pt remission since   . Sexual activity: Not on file  Other Topics Concern  . Not on file  Social History Narrative   Daily caffeine   Social Determinants of Health   Financial Resource Strain:   . Difficulty of Paying Living Expenses: Not on file  Food Insecurity:   . Worried About Charity fundraiser in the Last Year: Not on file  . Ran Out of Food in the Last Year: Not on file  Transportation Needs:   . Lack of Transportation (Medical): Not on file  . Lack of Transportation (Non-Medical): Not on file  Physical Activity:   . Days of Exercise per Week: Not on file  . Minutes of Exercise per Session: Not on file  Stress:   . Feeling of Stress : Not on file  Social Connections:   . Frequency of Communication with Friends and Family: Not on file  . Frequency of Social Gatherings with Friends and Family: Not on file  . Attends Religious Services: Not on file  . Active Member of Clubs or Organizations: Not on file  . Attends Archivist Meetings: Not on file  . Marital Status: Not on file  Intimate Partner Violence:   . Fear of Current or Ex-Partner: Not on file  . Emotionally Abused: Not on file  . Physically Abused: Not on file  . Sexually Abused: Not on file   Family History  Problem Relation Age of Onset  . Heart disease Father   . Heart disease Mother   . Diabetes Mother   . Colon cancer Neg Hx      Physical exam: BP 132/84   Pulse 65   Temp 98.5 F (36.9 C) (Oral)   Wt 156  lb (70.8 kg)   BMI 21.16 kg/m   Gen: Alert and oriented x 3, no acute distress HEENT: /AT, PERL, no scleral icterus, no pale conjunctivae, hearing normal, oral mucosa moist Neck: Supple, no lymphadenopathy Cardio: Regular rate and rhythm; +S1 and S2; no murmurs, gallops, or rubs Resp: CTAB; no wheezes, rhonchi, or rales GI: Soft, nontender, nondistended, bowel sounds present Extremities: No cyanosis, clubbing, or edema; +2 PT and DP pulses Skin: No rashes, lesions, or ecchymoses Neuro: No focal deficits Psych: Calm, cooperative  Laboratory  Referred from [employer], [place]  06/09/19 HCV ab reactive, HCV RNA 940000 IU/ml 06/26/19 HIV ag/ab 4th generation NR   Assessment/Plan: Hepatitis C Discussed the pathogenesis, transmission, prevention, risks of left untreated, and treatment options for hepatitis C  Follow Up in 4 weeks with Cassie Orders Placed This Encounter  Procedures  . US ABDOMEN COMPLETE W/ELASTOGRAPHY  . Hepatitis C RNA quantitative  . Hepatitis C genotype  . Liver Fibrosis, FibroTest-ActiTest  . CBC  . Comprehensive metabolic panel  . Protime-INR  . Hepatitis A antibody, total  . Hepatitis B surface antibody,qualitative  . Hepatitis B surface antigen  . Hepatitis B core antibody, total   Smoking/Alcohol Counseled   I spent greater than 60 minutes with the patient including greater than 50% of time in face to face counsel of the patient and in coordination of their care.   Patient's  labs were reviewed as well as his previous records. Patients questions were addressed and answered.   Rosiland Oz, MD Infectious Diseases  Office phone [phone] Fax no. [phone]

## 2019-10-05 NOTE — Telephone Encounter (Signed)
[facility] Patient Teacher, English as a foreign language completed.    The patient is insured through [place].  We will continue to follow to see if copay assistance is needed.  Ileene Patrick, [facility] Patient [facility] Phone: [phone] Fax:  [phone]

## 2019-10-13 LAB — LIVER FIBROSIS, FIBROTEST-ACTITEST
ALT: 31 U/L (ref 9–46)
Alpha-2-Macroglobulin: 192 mg/dL (ref 106–279)
Apolipoprotein A1: 165 mg/dL (ref 94–176)
Bilirubin: 0.5 mg/dL (ref 0.2–1.2)
Fibrosis Score: 0.29
GGT: 43 U/L (ref 3–70)
Haptoglobin: 112 mg/dL (ref 43–212)
Necroinflammat ACT Score: 0.15
Reference ID: 3564695

## 2019-10-13 LAB — HEPATITIS B CORE ANTIBODY, TOTAL: Hep B Core Total Ab: NONREACTIVE

## 2019-10-13 LAB — COMPREHENSIVE METABOLIC PANEL
AG Ratio: 1.6 (calc) (ref 1.0–2.5)
ALT: 30 U/L (ref 9–46)
AST: 16 U/L (ref 10–35)
Albumin: 4.6 g/dL (ref 3.6–5.1)
Alkaline phosphatase (APISO): 64 U/L (ref 35–144)
BUN: 11 mg/dL (ref 7–25)
CO2: 29 mmol/L (ref 20–32)
Calcium: 10 mg/dL (ref 8.6–10.3)
Chloride: 106 mmol/L (ref 98–110)
Creat: 0.84 mg/dL (ref 0.70–1.25)
Globulin: 2.9 g/dL (calc) (ref 1.9–3.7)
Glucose, Bld: 94 mg/dL (ref 65–99)
Potassium: 4.2 mmol/L (ref 3.5–5.3)
Sodium: 141 mmol/L (ref 135–146)
Total Bilirubin: 0.5 mg/dL (ref 0.2–1.2)
Total Protein: 7.5 g/dL (ref 6.1–8.1)

## 2019-10-13 LAB — PROTIME-INR
INR: 1
Prothrombin Time: 11.2 s (ref 9.0–11.5)

## 2019-10-13 LAB — CBC
HCT: 35.6 % — ABNORMAL LOW (ref 38.5–50.0)
Hemoglobin: 11.6 g/dL — ABNORMAL LOW (ref 13.2–17.1)
MCH: 22.2 pg — ABNORMAL LOW (ref 27.0–33.0)
MCHC: 32.6 g/dL (ref 32.0–36.0)
MCV: 68.1 fL — ABNORMAL LOW (ref 80.0–100.0)
MPV: 11.5 fL (ref 7.5–12.5)
Platelets: 291 10*3/uL (ref 140–400)
RBC: 5.23 10*6/uL (ref 4.20–5.80)
RDW: 19.7 % — ABNORMAL HIGH (ref 11.0–15.0)
WBC: 11 10*3/uL — ABNORMAL HIGH (ref 3.8–10.8)

## 2019-10-13 LAB — HEPATITIS B SURFACE ANTIBODY,QUALITATIVE: Hep B S Ab: NONREACTIVE

## 2019-10-13 LAB — HEPATITIS C RNA QUANTITATIVE
HCV RNA, PCR, QN (Log): 5.75 log IU/mL — ABNORMAL HIGH
HCV RNA, PCR, QN: 562000 IU/mL — ABNORMAL HIGH

## 2019-10-13 LAB — HEPATITIS B SURFACE ANTIGEN: Hepatitis B Surface Ag: NONREACTIVE

## 2019-10-13 LAB — HEPATITIS C GENOTYPE

## 2019-10-13 LAB — HEPATITIS A ANTIBODY, TOTAL: Hepatitis A AB,Total: REACTIVE — AB

## 2019-10-19 ENCOUNTER — Other Ambulatory Visit: Payer: Self-pay | Admitting: Pharmacist

## 2019-10-19 DIAGNOSIS — B182 Chronic viral hepatitis C: Secondary | ICD-10-CM

## 2019-10-19 MED ORDER — MAVYRET 100-40 MG PO TABS
3.0000 | ORAL_TABLET | Freq: Every day | ORAL | 1 refills | Status: DC
Start: 2019-10-19 — End: 2019-10-21

## 2019-10-19 NOTE — Progress Notes (Signed)
Mavyret Rx sent to [facility] for processing. Patient will need to hold simvastatin while on Mavyret as it is contraindicated. Unsure if he is still taking as it is stated as a historical med. Will discuss with patient.

## 2019-10-19 NOTE — Progress Notes (Signed)
Done. Thank you.

## 2019-10-21 ENCOUNTER — Other Ambulatory Visit: Payer: Self-pay | Admitting: Pharmacist

## 2019-10-21 DIAGNOSIS — B182 Chronic viral hepatitis C: Secondary | ICD-10-CM

## 2019-10-21 MED ORDER — SOFOSBUVIR-VELPATASVIR 400-100 MG PO TABS: 1.0000 | ORAL_TABLET | Freq: Every day | ORAL | 2 refills | Status: DC

## 2019-10-21 NOTE — Progress Notes (Signed)
Patient's insurance will not cover [place] but will cover [place] and Epclusa. Will send in Epclusa x 12 weeks. Dose of simvastatin ok with Epclusa. Will make sure to have patient separate Epclusa from omeprazole if he is still taking. Will discuss with patient when counseling him.

## 2019-10-22 ENCOUNTER — Telehealth: Payer: Self-pay

## 2019-10-22 NOTE — Telephone Encounter (Signed)
[facility] Patient Advocate Encounter   Received notification from [facility] that prior authorization for [place] is required.   PA submitted on 10/21/19 Key [id number] Status is pending    [facility] will continue to follow.   Ileene Patrick, [facility] Patient [facility] for [facility] Disease Phone: [phone] Fax:  [phone]

## 2019-10-23 ENCOUNTER — Telehealth: Payer: Self-pay

## 2019-10-23 NOTE — Telephone Encounter (Signed)
[facility] Patient Advocate Encounter  Prior Authorization for [employer] has been approved.    PA# [id number] Effective dates: 10/23/19 through 1230/21  Patients co-pay is $3.64.   [facility] will continue to follow.  Ileene Patrick, [facility] Patient [facility] for Infectious Disease Phone: [phone] Fax:  [phone]

## 2019-10-26 MED FILL — EPCLUSA 400 MG-100 MG TAB: 400-100 | 28 days supply | Qty: 28 | Fill #0

## 2019-10-28 ENCOUNTER — Ambulatory Visit (HOSPITAL_COMMUNITY): Payer: Medicare HMO

## 2019-11-02 ENCOUNTER — Ambulatory Visit: Payer: Medicare HMO | Admitting: Pharmacist

## 2019-11-03 ENCOUNTER — Other Ambulatory Visit: Payer: Self-pay

## 2019-11-03 ENCOUNTER — Ambulatory Visit (HOSPITAL_COMMUNITY)
Admission: RE | Admit: 2019-11-03 | Discharge: 2019-11-03 | Disposition: A | Discharge status: Home / Self Care | Payer: Medicare HMO | Source: Ambulatory Visit | Attending: Infectious Diseases | Admitting: Infectious Diseases

## 2019-11-03 DIAGNOSIS — B182 Chronic viral hepatitis C: Secondary | ICD-10-CM | POA: Insufficient documentation

## 2019-11-19 MED FILL — EPCLUSA 400 MG-100 MG TAB: 400-100 | 28 days supply | Qty: 28 | Fill #1

## 2019-11-24 ENCOUNTER — Ambulatory Visit (INDEPENDENT_AMBULATORY_CARE_PROVIDER_SITE_OTHER): Payer: Medicare HMO | Admitting: Pharmacist

## 2019-11-24 ENCOUNTER — Other Ambulatory Visit: Payer: Self-pay

## 2019-11-24 DIAGNOSIS — B182 Chronic viral hepatitis C: Secondary | ICD-10-CM

## 2019-11-24 DIAGNOSIS — Z23 Encounter for immunization: Secondary | ICD-10-CM

## 2019-11-24 NOTE — Progress Notes (Signed)
HPI: Xavier Campbell is a 64 y.o. male who presents to the [facility] clinic for Hepatitis C follow-up.  Medication: Epclusa x 12 weeks  Start Date: 11/05/19  Hepatitis C Genotype: 1a  Fibrosis Score: F1  Hepatitis C RNA: 562,000  Patient Active Problem List   Diagnosis Date Noted  . 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 Prolapsed internal hemorrhoids, grade 3, status post hemorrhoidal ligation/pexy/excision 08/09/2016 08/09/2016  . Spinal stenosis, lumbar region with neurogenic claudication 01/25/2016  . Arm numbness 04/15/2012  . Displacement of cervical intervertebral disc without myelopathy 03/14/2012  . Disturbance of skin sensation 03/14/2012  . Stricture and stenosis of esophagus 06/12/2011  . Sickle cell anemia (HCC)   . Esophageal reflux 04/09/2011  . 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 HYPERTROPHY PROSTATE W/O UR OBST & OTH LUTS 10/25/2008  . OTHER HEMOGLOBINOPATHIES 10/15/2008  . LIPOMA 09/24/2008  . ABSCESS, TOOTH 09/24/2008  . OSTEOARTHRITIS, CERVICAL SPINE 09/24/2008  . CHEST PAIN UNSPECIFIED 09/24/2008  . ORBITAL FLOOR , CLOSED FRACTURE 09/24/2008  . 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 APPENDECTOMY, HX OF 09/24/2008    Patient's Medications  New Prescriptions   No medications on file  Previous Medications   AMITRIPTYLINE (ELAVIL) 100 MG TABLET    Take 100 mg by mouth at bedtime.   BRIMONIDINE-TIMOLOL (COMBIGAN) 0.2-0.5 % OPHTHALMIC SOLUTION    Place 1 drop into both eyes 2 (two) times daily.    ESCITALOPRAM (LEXAPRO) 20 MG TABLET    Take 20 mg by mouth every evening.    FERROUS SULFATE 325 (65 FE) MG TABLET    Take 650 mg by mouth daily with breakfast.   HYDROCODONE-ACETAMINOPHEN (NORCO) 10-325 MG TABLET    Take 1 tablet by mouth every 4 (four) hours as needed.   HYDROCORTISONE (ANUSOL-HC) 2.5 % RECTAL CREAM    Place 1 application rectally 2 (two) times daily. Apply around anus for irritated & painful hemorrhoids    LIDOCAINE (XYLOCAINE) 5 % OINTMENT    Use 2-3 times daily   LISINOPRIL (PRINIVIL,ZESTRIL) 10 MG TABLET    Take 10 mg by mouth every morning.    NAPROXEN (NAPROSYN) 500 MG TABLET    Take 1 tablet (500 mg total) by mouth 2 (two) times daily with a meal.   OMEPRAZOLE (PRILOSEC) 20 MG CAPSULE    Take 20 mg by mouth every morning.    OXYCODONE (OXY IR/ROXICODONE) 5 MG IMMEDIATE RELEASE TABLET    Take 1-2 tablets (5-10 mg total) by mouth every 4 (four) hours as needed for moderate pain, severe pain or breakthrough pain.   SIMVASTATIN (ZOCOR) 10 MG TABLET    Take 10 mg by mouth every evening.    SOFOSBUVIR-VELPATASVIR (EPCLUSA) 400-100 MG TABS    Take 1 tablet by mouth daily.   TRAVOPROST, BAK FREE, (TRAVATAN) 0.004 % SOLN OPHTHALMIC SOLUTION    Place 1 drop into both eyes at bedtime.    ZOLPIDEM (AMBIEN) 10 MG TABLET    Take 10 mg by mouth at bedtime.  Modified Medications   No medications on file  Discontinued Medications   No medications on file    Allergies: No Known Allergies  Past Medical History: Past Medical History:  Diagnosis Date  . Alcoholism in remission [facility])    per pt treatment 10/ 2009 and has been remission since  . BPH (benign prostatic hyperplasia)   . Chronic back pain   . DDD (degenerative disc disease), cervical   . ED (erectile  dysfunction) of organic origin   . Family history of sickle cell anemia    2 sisters  . Full dentures   . GERD (gastroesophageal reflux disease)   . Glaucoma, both eyes   . Grade III internal hemorrhoids    symptomatic  . History of adenomatous polyp of colon    06-21-2016  tubular adenoma   . History of drug abuse ([place])    per pt treatment 10/ 2009 (crack) in remission since  . History of esophageal stricture    s/p  dilation 04-10-2011  . History of facial fracture 07/2007   left side multiple facial fx's involving nose, eye, jaw, cheek  . 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 age 71's  . Hyperlipidemia     . Hypertension   . Iron deficiency anemia   . Osteoarthritis   . Seasonal allergies   . Sickle cell trait ([place])     Social History: Social History   Socioeconomic History  . Marital status: Single    Spouse name: Not on file  . Number of children: 2  . Years of education: Not on file  . Highest education level: Not on file  Occupational History  . Occupation: Unemployed  Tobacco Use  . Smoking status: Current Every Day Smoker    Packs/day: 0.25    Years: 46.00    Pack years: 11.50    Types: Cigarettes  . Smokeless tobacco: Never Used  . Tobacco comment: 1pp3d  Vaping Use  . Vaping Use: Never used  Substance and Sexual Activity  . Alcohol use: Yes    Comment: alcoholic -- treatment 71/ 2009 , per pt remission since  . Drug use: No    Comment: hx crack addict--  treatment 10/ 2009  per pt remission since   . Sexual activity: Not on file  Other Topics Concern  . Not on file  Social History Narrative   Daily caffeine   Social Determinants of Health   Financial Resource Strain:   . Difficulty of Paying Living Expenses: Not on file  Food Insecurity:   . Worried About Charity fundraiser in the Last Year: Not on file  . Ran Out of Food in the Last Year: Not on file  Transportation Needs:   . Lack of Transportation (Medical): Not on file  . Lack of Transportation (Non-Medical): Not on file  Physical Activity:   . Days of Exercise per Week: Not on file  . Minutes of Exercise per Session: Not on file  Stress:   . Feeling of Stress : Not on file  Social Connections:   . Frequency of Communication with Friends and Family: Not on file  . Frequency of Social Gatherings with Friends and Family: Not on file  . Attends Religious Services: Not on file  . Active Member of Clubs or Organizations: Not on file  . Attends Archivist Meetings: Not on file  . Marital Status: Not on file    Labs: Hepatitis C Lab Results  Component Value Date   HCVGENOTYPE 1a  10/05/2019   HCVRNAPCRQN 562,000 (H) 10/05/2019   FIBROSTAGE F1 10/05/2019   Hepatitis B Lab Results  Component Value Date   HEPBSAB NON-REACTIVE 10/05/2019   HEPBSAG NON-REACTIVE 10/05/2019   HEPBCAB NON-REACTIVE 10/05/2019   Hepatitis A Lab Results  Component Value Date   HAV REACTIVE (A) 10/05/2019   HIV Lab Results  Component Value Date   HIV NON REAC 09/24/2008   Lab Results  Component  Value Date   CREATININE 0.84 10/05/2019   CREATININE 0.70 08/09/2016   CREATININE 0.86 01/18/2016   CREATININE 0.70 09/24/2013   CREATININE 0.68 09/24/2013   Lab Results  Component Value Date   AST 16 10/05/2019   AST 22 01/18/2016   AST 25 09/24/2013   ALT 31 10/05/2019   ALT 30 10/05/2019   ALT 23 01/18/2016   INR 1.0 10/05/2019   INR 1.08 01/18/2016   INR 1.08 09/13/2011    Assessment: Mr. Bergeson presents to clinic for his 26-month hepatitis C follow-up. He has been taking his Epclusa every day without any missed doses or adverse effects. I told him to continue taking his medication everyday, and congratulated him on his adherence thus far. He takes omeprazole 20mg  once daily in the evenings and takes his Epclusa every morning without any issues. Will check HCV RNA today and have him follow-up at end of treatment with Dr. West Bali.  Of note, he does not have immunity to hepatitis B, so will start his hepatitis B vaccine series today. Will schedule follow-up with pharmacy clinic in 1 month to receive his second shot.   Plan: Check HCV RNA Follow-up with Dr. West Bali on 02/16/19 @ 2:30PM Administered hepatitis B vaccine #1 Follow-up in 1 month for hepatitis B vaccine #2 (12/14 @ 4PM)   Alfonse Spruce, PharmD PGY2 [facility] Pharmacy Resident [phone]  11/24/2019, 3:42 PM

## 2019-11-26 LAB — HEPATITIS C RNA QUANTITATIVE
HCV RNA, PCR, QN (Log): <1.18 log IU/mL
HCV RNA, PCR, QN: <15 IU/mL

## 2019-12-16 MED FILL — EPCLUSA 400 MG-100 MG TAB: 400-100 | 28 days supply | Qty: 28 | Fill #2

## 2019-12-28 ENCOUNTER — Other Ambulatory Visit: Payer: Self-pay

## 2019-12-28 ENCOUNTER — Ambulatory Visit (INDEPENDENT_AMBULATORY_CARE_PROVIDER_SITE_OTHER): Payer: Medicare HMO | Admitting: Pharmacist

## 2019-12-28 DIAGNOSIS — Z23 Encounter for immunization: Secondary | ICD-10-CM | POA: Insufficient documentation

## 2019-12-28 DIAGNOSIS — B182 Chronic viral hepatitis C: Secondary | ICD-10-CM | POA: Diagnosis not present

## 2019-12-28 NOTE — Progress Notes (Signed)
Patient presents for 2nd vaccination against Hepatitis B. Consent given. Counseling provided. No contraindications exists. Vaccine administered without incident.   Lorel Monaco, PharmD, [facility] PGY2 Ambulatory Care Resident [place]

## 2020-02-16 ENCOUNTER — Ambulatory Visit: Payer: Medicare HMO | Admitting: Infectious Diseases

## 2020-02-18 ENCOUNTER — Ambulatory Visit: Payer: Medicare Other | Admitting: Infectious Diseases

## 2020-02-18 NOTE — Progress Notes (Deleted)
[facility]                                      [address], [place], [place], [address]                                               Phn. [phone]; Fax: [phone]                                                               Date: 02/18/20 Reason for Visit: Hepatitis C    HPI: Xavier Campbell is a 65 y.o.old male with PMH as below who is here for follow up for his hepatitic Follow up. He started taking Epclusa on 10/21 and has been taking it every day without missing any doses.    ROS: Denies yellowish discoloration of sclera and skin, abdominal pain/distension, hematemesis.            Dcough, fever, chills, nightsweats, nausea, vomiting, diarrhea, constipation, weight loss, recent hospitalizations, rashes, joint complaints, shortness of breath, chest pain, headaches, dysuria .  Current Outpatient Medications on File Prior to Visit  Medication Sig Dispense Refill  . amitriptyline (ELAVIL) 100 MG tablet Take 100 mg by mouth at bedtime.    . brimonidine-timolol (COMBIGAN) 0.2-0.5 % ophthalmic solution Place 1 drop into both eyes 2 (two) times daily.     Marland Kitchen escitalopram (LEXAPRO) 20 MG tablet Take 20 mg by mouth every evening.     . ferrous sulfate 325 (65 FE) MG tablet Take 650 mg by mouth daily with breakfast.    . HYDROcodone-acetaminophen (NORCO) 10-325 MG tablet Take 1 tablet by mouth every 4 (four) hours as needed.    . hydrocortisone (ANUSOL-HC) 2.5 % rectal cream Place 1 application rectally 2 (two) times daily. Apply around anus for irritated & painful hemorrhoids 15 g 2  . lidocaine (XYLOCAINE) 5 % ointment Use 2-3 times daily (Patient taking differently: Apply 1 application topically. Use 2-3 times daily as needed for hemorrhoid flare-up) 35.44 g 0  . lisinopril (PRINIVIL,ZESTRIL) 10 MG tablet Take 10 mg by mouth every morning.     . naproxen (NAPROSYN) 500 MG tablet Take 1 tablet (500 mg total) by  mouth 2 (two) times daily with a meal. 40 tablet 1  . omeprazole (PRILOSEC) 20 MG capsule Take 20 mg by mouth every morning.     Marland Kitchen oxyCODONE (OXY IR/ROXICODONE) 5 MG immediate release tablet Take 1-2 tablets (5-10 mg total) by mouth every 4 (four) hours as needed for moderate pain, severe pain or breakthrough pain. 40 tablet 0  . simvastatin (ZOCOR) 10 MG tablet Take 10 mg by mouth every evening.     . Sofosbuvir-Velpatasvir (EPCLUSA) 400-100 MG TABS Take 1 tablet by mouth daily. 28 tablet 2  . Travoprost, BAK Free, (TRAVATAN) 0.004 % SOLN ophthalmic solution Place 1 drop into both eyes at bedtime.     Marland Kitchen zolpidem (AMBIEN) 10 MG tablet Take 10 mg by mouth at bedtime.     No current facility-administered medications on file prior  to visit.     No Known Allergies  Past Medical History:  Diagnosis Date  . Alcoholism in remission [facility])    per pt treatment 10/ 2009 and has been remission since  . BPH (benign prostatic hyperplasia)   . Chronic back pain   . DDD (degenerative disc disease), cervical   . ED (erectile dysfunction) of organic origin   . Family history of sickle cell anemia    2 sisters  . Full dentures   . GERD (gastroesophageal reflux disease)   . Glaucoma, both eyes   . Grade III internal hemorrhoids    symptomatic  . History of adenomatous polyp of colon    06-21-2016  tubular adenoma   . History of drug abuse ([place])    per pt treatment 10/ 2009 (crack) in remission since  . History of esophageal stricture    s/p  dilation 04-10-2011  . History of facial fracture 07/2007   left side multiple facial fx's involving nose, eye, jaw, cheek  . 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 Hyperlipidemia   . Hypertension   . Iron deficiency anemia   . Osteoarthritis   . Seasonal allergies   . Sickle cell trait [facility])    Past Surgical History:  Procedure Laterality Date  . ANTERIOR CERVICAL DECOMP/DISCECTOMY FUSION  09/13/2011   Procedure:  ANTERIOR CERVICAL DECOMPRESSION/DISCECTOMY FUSION 1 LEVEL;  Surgeon: Otilio Connors, MD;  Location: [place] [facility]UR[facility];  Service: Neurosurgery;  Laterality: Bilateral;  Anterior Cervical Three-Four Decompression/Diskectomy and fusion  . ANTERIOR CERVICAL DECOMP/DISCECTOMY FUSION  05/23/2009   C4 -- C7  . APPENDECTOMY  age 28  . CATARACT EXTRACTION W/ INTRAOCULAR LENS  IMPLANT, BILATERAL  12/2015  . COLONOSCOPY  last one 06-21-2016  . ESOPHAGOGASTRODUODENOSCOPY  last one 04-10-2011  . HEMORRHOID SURGERY N/A 08/09/2016   Procedure: HEMORRHOIDECTOMY x 2, HEMORRHOIDAL LIGATION/PEXY, ANORECTAL EXAMINATION UNDER ANESTHESIA , excision of anal canal mass;  Surgeon: Michael Boston, MD;  Location: [place];  Service: General;  Laterality: N/A;  GENERAL AND LOCAL ANESTHESIA   . LIPOMA EXCISION  10/29/2008   back   . LUMBAR LAMINECTOMY/DECOMPRESSION MICRODISCECTOMY Bilateral 01/25/2016   Procedure: Central decompression, lumbar laminectomy L3-L4 and L4-L5, and bilateral foraminotomy L4-L5;  Surgeon: Latanya Maudlin, MD;  Location[facility];  Service: Orthopedics;  Laterality: Bilateral;  . ORBITAL FX REPAIR Left 2009   CLEARED FOR MRI.   Social History   Socioeconomic History  . Marital status: Single    Spouse name: Not on file  . Number of children: 2  . Years of education: Not on file  . Highest education level: Not on file  Occupational History  . Occupation: Unemployed  Tobacco Use  . Smoking status: Current Every Day Smoker    Packs/day: 0.25    Years: 46.00    Pack years: 11.50    Types: Cigarettes  . Smokeless tobacco: Never Used  . Tobacco comment: 1pp3d  Vaping Use  . Vaping Use: Never used  Substance and Sexual Activity  . Alcohol use: Yes    Comment: alcoholic -- treatment 47/ 2009 , per pt remission since  . Drug use: No    Comment: hx crack addict--  treatment 10/ 2009  per pt remission since   . Sexual activity: Not on file  Other Topics Concern  . Not on file   Social History Narrative   Daily caffeine   Social Determinants of Health   Financial Resource Strain:  Not on file  Food Insecurity: Not on file  Transportation Needs: Not on file  Physical Activity: Not on file  Stress: Not on file  Social Connections: Not on file  Intimate Partner Violence: Not on file   Family History  Problem Relation Age of Onset  . Heart disease Father   . Heart disease Mother   . Diabetes Mother   . Colon cancer Neg Hx      Physical exam: There were no vitals taken for this visit.  Gen: Alert and oriented x 3, no acute distress HEENT: [place]/AT, PERL, no scleral icterus, no pale conjunctivae, hearing normal, oral mucosa moist Neck: Supple, no lymphadenopathy Cardio: Regular rate and rhythm; +S1 and S2; no murmurs, gallops, or rubs Resp: CTAB; no wheezes, rhonchi, or rales GI: Soft, nontender, nondistended, bowel sounds present Extremities: No cyanosis, clubbing, or edema; +2 PT and DP pulses Skin: No rashes, lesions, or ecchymoses Neuro: No focal deficits Psych: Calm, cooperative  Laboratory  Referred from [employer], [place]  06/09/19 HCV ab reactive, HCV RNA 940000 IU/ml 06/26/19 HIV ag/ab 4th generation NR   Assessment/Plan: # Chronic Hepatitis C, Treatment Naive Hepatitis C Genotype: 1a Fibrosis Score: F1 Hepatitis C RNA: 562,000   Smoking/Alcohol Counseled   I spent approximately 30 minutes with the patient including greater than 50% of time in face to face counsel of the patient and in coordination of their care.   Patient's labs were reviewed as well as his previous records. Patients questions were addressed and answered.   Rosiland Oz, MD Infectious Diseases  Office phone [phone] Fax no. [phone]

## 2020-02-24 ENCOUNTER — Other Ambulatory Visit: Payer: Self-pay

## 2020-02-24 ENCOUNTER — Encounter: Payer: Self-pay | Admitting: Infectious Diseases

## 2020-02-24 ENCOUNTER — Ambulatory Visit (INDEPENDENT_AMBULATORY_CARE_PROVIDER_SITE_OTHER): Payer: Medicare Other | Admitting: Infectious Diseases

## 2020-02-24 VITALS — BP 112/77 | HR 77 | Temp 98.6°F | Wt 161.0 lb

## 2020-02-24 DIAGNOSIS — Z7189 Other specified counseling: Secondary | ICD-10-CM | POA: Diagnosis not present

## 2020-02-24 DIAGNOSIS — Z7185 Encounter for immunization safety counseling: Secondary | ICD-10-CM | POA: Diagnosis not present

## 2020-02-24 DIAGNOSIS — B182 Chronic viral hepatitis C: Secondary | ICD-10-CM | POA: Diagnosis not present

## 2020-02-24 NOTE — Patient Instructions (Signed)
Fu in 3 months to check Viral load to make sure you have been cured of hepatitis C Beware of risk of reinfection with Hepatitis C

## 2020-02-24 NOTE — Progress Notes (Signed)
[facility]                                      [address], [place], [place], [address]                                               Phn. [phone]; Fax: [phone]                                                               Date: 02/24/20 Reason for Visit: Hepatitis C EOT    HPI: Xavier Campbell is a 65 y.o.old male who is here for follow of Hep C treatment. He was started on Epclusa on 10/21 and has completed 12 weeks of treatment. He says he completed his medicine approx a month ago and denies missing any doses. Denies any side effects associated with meds. He feels overall well and has no complaints today.  Discussed at length regarding recurrence of infection with ongoing risk factors associated with Hep C inection. He says he has been vaccinated with COVID including booster.   ROS: Denies yellowish discoloration of sclera and skin, abdominal pain/distension, hematemesis.            Denies cough, fever, chills, nightsweats, nausea, vomiting, diarrhea, constipation, weight loss, recent hospitalizations, rashes, joint complaints, shortness of breath, chest pain, headaches, dysuria .   Current Outpatient Medications on File Prior to Visit  Medication Sig Dispense Refill  . amitriptyline (ELAVIL) 100 MG tablet Take 100 mg by mouth at bedtime.    . brimonidine-timolol (COMBIGAN) 0.2-0.5 % ophthalmic solution Place 1 drop into both eyes 2 (two) times daily.     Marland Kitchen escitalopram (LEXAPRO) 20 MG tablet Take 20 mg by mouth every evening.     . ferrous sulfate 325 (65 FE) MG tablet Take 650 mg by mouth daily with breakfast.    . HYDROcodone-acetaminophen (NORCO) 10-325 MG tablet Take 1 tablet by mouth every 4 (four) hours as needed.    . hydrocortisone (ANUSOL-HC) 2.5 % rectal cream Place 1 application rectally 2 (two) times daily. Apply around anus for irritated & painful hemorrhoids 15 g 2  . lidocaine  (XYLOCAINE) 5 % ointment Use 2-3 times daily (Patient taking differently: Apply 1 application topically. Use 2-3 times daily as needed for hemorrhoid flare-up) 35.44 g 0  . lisinopril (PRINIVIL,ZESTRIL) 10 MG tablet Take 10 mg by mouth every morning.     . naproxen (NAPROSYN) 500 MG tablet Take 1 tablet (500 mg total) by mouth 2 (two) times daily with a meal. 40 tablet 1  . omeprazole (PRILOSEC) 20 MG capsule Take 20 mg by mouth every morning.     Marland Kitchen oxyCODONE (OXY IR/ROXICODONE) 5 MG immediate release tablet Take 1-2 tablets (5-10 mg total) by mouth every 4 (four) hours as needed for moderate pain, severe pain or breakthrough pain. 40 tablet 0  . simvastatin (ZOCOR) 10 MG tablet Take 10 mg by mouth every evening.     . Sofosbuvir-Velpatasvir (EPCLUSA) 400-100 MG TABS Take 1 tablet by mouth  daily. 28 tablet 2  . Travoprost, BAK Free, (TRAVATAN) 0.004 % SOLN ophthalmic solution Place 1 drop into both eyes at bedtime.     Marland Kitchen zolpidem (AMBIEN) 10 MG tablet Take 10 mg by mouth at bedtime.     No current facility-administered medications on file prior to visit.     No Known Allergies  Past Medical History:  Diagnosis Date  . Alcoholism in remission [facility])    per pt treatment 10/ 2009 and has been remission since  . BPH (benign prostatic hyperplasia)   . Chronic back pain   . DDD (degenerative disc disease), cervical   . ED (erectile dysfunction) of organic origin   . Family history of sickle cell anemia    2 sisters  . Full dentures   . GERD (gastroesophageal reflux disease)   . Glaucoma, both eyes   . Grade III internal hemorrhoids    symptomatic  . History of adenomatous polyp of colon    06-21-2016  tubular adenoma   . History of drug abuse ([place])    per pt treatment 10/ 2009 (crack) in remission since  . History of esophageal stricture    s/p  dilation 04-10-2011  . History of facial fracture 07/2007   left side multiple facial fx's involving nose, eye, jaw, cheek  . History of  Guillain-Barre syndrome due to influenza immunization age 44's  . Hyperlipidemia   . Hypertension   . Iron deficiency anemia   . Osteoarthritis   . Seasonal allergies   . Sickle cell trait [facility])    Past Surgical History:  Procedure Laterality Date  . ANTERIOR CERVICAL DECOMP/DISCECTOMY FUSION  09/13/2011   Procedure: ANTERIOR CERVICAL DECOMPRESSION/DISCECTOMY FUSION 1 LEVEL;  Surgeon: Otilio Connors, MD;  Location: [place] [facility]UR[facility];  Service: Neurosurgery;  Laterality: Bilateral;  Anterior Cervical Three-Four Decompression/Diskectomy and fusion  . ANTERIOR CERVICAL DECOMP/DISCECTOMY FUSION  05/23/2009   C4 -- C7  . APPENDECTOMY  age 75  . CATARACT EXTRACTION W/ INTRAOCULAR LENS  IMPLANT, BILATERAL  12/2015  . COLONOSCOPY  last one 06-21-2016  . ESOPHAGOGASTRODUODENOSCOPY  last one 04-10-2011  . HEMORRHOID SURGERY N/A 08/09/2016   Procedure: HEMORRHOIDECTOMY x 2, HEMORRHOIDAL LIGATION/PEXY, ANORECTAL EXAMINATION UNDER ANESTHESIA , excision of anal canal mass;  Surgeon: Michael Boston, MD;  Location: [place];  Service: General;  Laterality: N/A;  GENERAL AND LOCAL ANESTHESIA   . LIPOMA EXCISION  10/29/2008   back   . LUMBAR LAMINECTOMY/DECOMPRESSION MICRODISCECTOMY Bilateral 01/25/2016   Procedure: Central decompression, lumbar laminectomy L3-L4 and L4-L5, and bilateral foraminotomy L4-L5;  Surgeon: Latanya Maudlin, MD;  [facility];  Service: Orthopedics;  Laterality: Bilateral;  . ORBITAL FX REPAIR Left 2009   CLEARED FOR MRI.   Social History   Socioeconomic History  . Marital status: Single    Spouse name: Not on file  . Number of children: 2  . Years of education: Not on file  . Highest education level: Not on file  Occupational History  . Occupation: Unemployed  Tobacco Use  . Smoking status: Current Every Day Smoker    Packs/day: 0.25    Years: 46.00    Pack years: 11.50    Types: Cigarettes  . Smokeless tobacco: Never Used  . Tobacco comment: 1pp3d   Vaping Use  . Vaping Use: Never used  Substance and Sexual Activity  . Alcohol use: Yes    Comment: alcoholic -- treatment 53/ 2009 , per pt remission since  . Drug use: No  Comment: hx crack addict--  treatment 10/ 2009  per pt remission since   . Sexual activity: Not on file  Other Topics Concern  . Not on file  Social History Narrative   Daily caffeine   Social Determinants of Health   Financial Resource Strain: Not on file  Food Insecurity: Not on file  Transportation Needs: Not on file  Physical Activity: Not on file  Stress: Not on file  Social Connections: Not on file  Intimate Partner Violence: Not on file   Vitals   Gen: Alert and oriented x 3, no acute distress HEENT: [place]/AT, PERL, EOMI, no scleral icterus, no pale conjunctivae, hearing normal, oral mucosa moist Neck: Supple, no lymphadenopathy Cardio: Regular rate and rhythm; +S1 and S2; no murmurs, gallops, or rubs Resp: CTAB; no wheezes, rhonchi, or rales GI: Soft, nontender, nondistended, bowel sounds present GU: Musc: Extremities: No cyanosis, clubbing, or edema; +2 PT and DP pulses Skin: No rashes, lesions, or ecchymoses Neuro: No focal deficits Psych: Calm, cooperative  Assessment/Plan: See problem based charting in encounter tabs  I spent 30 minutes with the patient including review of prior medical records with greater than 50% of time in face to face counsel of the patient.   Patient's labs were reviewed as well as his previous records. Patients questions were addressed and answered.   Electronically signed by:  Rosiland Oz, MD Infectious Diseases  Office phone [phone] Fax no. [phone]

## 2020-02-24 NOTE — Assessment & Plan Note (Signed)
Has completed 12 weeks of Epclusaa approx a month ago Check HCV RNA Fu in 3 months for 12 weeks post treatment SVR

## 2020-02-24 NOTE — Assessment & Plan Note (Signed)
Immune to Hep A Has received 2 doses of Hepatitis B vaccine  Says has received covid vaccine including booster

## 2020-02-24 NOTE — Assessment & Plan Note (Signed)
Counseling done on risk of reinfection with IVDU and other risk factors associated with Hep C infection

## 2020-02-27 LAB — HCV RNA, QUANT REAL-TIME PCR W/REFLEX
HCV RNA, PCR, QN (Log): <1.18 LogIU/mL
HCV RNA, PCR, QN: <15 IU/mL

## 2020-03-08 NOTE — Progress Notes (Signed)
Patient referred by Javier Docker, MD for CAD screening  Subjective:   Xavier Campbell, male    DOB: 02/23/1955, 65 y.o.   MRN: [id number]   Chief Complaint  Patient presents with  . Coronary Artery Disease  . Hypertension     HPI  66 y.o. African American male with hypertension, mixed hyperlipidemia, h/o sickle cell trait, remote h/o alcohol abuse, referred for screening for CAD  Patient lives alone, is on disability due to back pain. He is still able to walk 1-2 miles without chest pain, shortness of breath, palpitations, leg edema, orthopnea, PND, TIA/syncope. He is trying to quit smoking and wears nicotine patch. He is down to 3 cigarettes/day.He quit drinking alcohol in 2009.    Past Medical History:  Diagnosis Date  . Alcoholism in remission [facility])    per pt treatment 10/ 2009 and has been remission since  . BPH (benign prostatic hyperplasia)   . Chronic back pain   . DDD (degenerative disc disease), cervical   . ED (erectile dysfunction) of organic origin   . Family history of sickle cell anemia    2 sisters  . Full dentures   . GERD (gastroesophageal reflux disease)   . Glaucoma, both eyes   . Grade III internal hemorrhoids    symptomatic  . History of adenomatous polyp of colon    06-21-2016  tubular adenoma   . History of drug abuse ([place])    per pt treatment 10/ 2009 (crack) in remission since  . History of esophageal stricture    s/p  dilation 04-10-2011  . History of facial fracture 07/2007   left side multiple facial fx's involving nose, eye, jaw, cheek  . 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 age 63's  . Hyperlipidemia   . Hypertension   . Iron deficiency anemia   . Osteoarthritis   . Seasonal allergies   . Sickle cell trait [facility])      Past Surgical History:  Procedure Laterality Date  . ANTERIOR CERVICAL DECOMP/DISCECTOMY FUSION  09/13/2011   Procedure: ANTERIOR CERVICAL DECOMPRESSION/DISCECTOMY FUSION 1 LEVEL;   Surgeon: Otilio Connors, MD;  Location: [place] [facility];  Service: Neurosurgery;  Laterality: Bilateral;  Anterior Cervical Three-Four Decompression/Diskectomy and fusion  . ANTERIOR CERVICAL DECOMP/DISCECTOMY FUSION  05/23/2009   C4 -- C7  . APPENDECTOMY  age 47  . CATARACT EXTRACTION W/ INTRAOCULAR LENS  IMPLANT, BILATERAL  12/2015  . COLONOSCOPY  last one 06-21-2016  . ESOPHAGOGASTRODUODENOSCOPY  last one 04-10-2011  . HEMORRHOID SURGERY N/A 08/09/2016   Procedure: HEMORRHOIDECTOMY x 2, HEMORRHOIDAL LIGATION/PEXY, ANORECTAL EXAMINATION UNDER ANESTHESIA , excision of anal canal mass;  Surgeon: Michael Boston, MD;  Location: [place];  Service: General;  Laterality: N/A;  GENERAL AND LOCAL ANESTHESIA   . LIPOMA EXCISION  10/29/2008   back   . LUMBAR LAMINECTOMY/DECOMPRESSION MICRODISCECTOMY Bilateral 01/25/2016   Procedure: Central decompression, lumbar laminectomy L3-L4 and L4-L5, and bilateral foraminotomy L4-L5;  Surgeon: Latanya Maudlin, MD;  [facility];  Service: Orthopedics;  Laterality: Bilateral;  . ORBITAL FX REPAIR Left 2009   CLEARED FOR MRI.     Social History   Tobacco Use  Smoking Status Current Every Day Smoker  . Packs/day: 0.25  . Years: 46.00  . Pack years: 11.50  . Types: Cigarettes  Smokeless Tobacco Never Used  Tobacco Comment   1pp3d    Social History   Substance and Sexual Activity  Alcohol Use Yes  Comment: alcoholic -- treatment 71/ 2009 , per pt remission since     Family History  Problem Relation Age of Onset  . Heart disease Father   . Heart disease Mother   . Diabetes Mother   . Colon cancer Neg Hx      Current Outpatient Medications on File Prior to Visit  Medication Sig Dispense Refill  . amitriptyline (ELAVIL) 100 MG tablet Take 100 mg by mouth at bedtime.    . brimonidine-timolol (COMBIGAN) 0.2-0.5 % ophthalmic solution Place 1 drop into both eyes 2 (two) times daily.     Marland Kitchen escitalopram (LEXAPRO) 20 MG tablet  Take 20 mg by mouth every evening.     . ferrous sulfate 325 (65 FE) MG tablet Take 650 mg by mouth daily with breakfast.    . HYDROcodone-acetaminophen (NORCO) 10-325 MG tablet Take 1 tablet by mouth every 4 (four) hours as needed.    . hydrocortisone (ANUSOL-HC) 2.5 % rectal cream Place 1 application rectally 2 (two) times daily. Apply around anus for irritated & painful hemorrhoids 15 g 2  . lidocaine (XYLOCAINE) 5 % ointment Use 2-3 times daily (Patient taking differently: Apply 1 application topically. Use 2-3 times daily as needed for hemorrhoid flare-up) 35.44 g 0  . lisinopril (PRINIVIL,ZESTRIL) 10 MG tablet Take 10 mg by mouth every morning.     . naproxen (NAPROSYN) 500 MG tablet Take 1 tablet (500 mg total) by mouth 2 (two) times daily with a meal. 40 tablet 1  . omeprazole (PRILOSEC) 20 MG capsule Take 20 mg by mouth every morning.     Marland Kitchen oxyCODONE (OXY IR/ROXICODONE) 5 MG immediate release tablet Take 1-2 tablets (5-10 mg total) by mouth every 4 (four) hours as needed for moderate pain, severe pain or breakthrough pain. 40 tablet 0  . simvastatin (ZOCOR) 10 MG tablet Take 10 mg by mouth every evening.     . Travoprost, BAK Free, (TRAVATAN) 0.004 % SOLN ophthalmic solution Place 1 drop into both eyes at bedtime.     Marland Kitchen zolpidem (AMBIEN) 10 MG tablet Take 10 mg by mouth at bedtime.     No current facility-administered medications on file prior to visit.    Cardiovascular and other pertinent studies:  EKG 03/09/2020: Sinus rhythm 71 bpm Leftward axis Old anteroseptal infarct   Recent labs: 06/26/2019: H/H 11.3/36.1. MCV 67.6. Platelets N/A   Review of Systems  Cardiovascular: Negative for chest pain, dyspnea on exertion, leg swelling, palpitations and syncope.  Musculoskeletal: Positive for back pain.       Vitals:   03/09/20 1325  BP: (!) 144/89  Pulse: 72  Resp: 16  Temp: 98 F (36.7 C)  SpO2: 95%     Body mass index is 22.78 kg/m. Filed Weights   03/09/20  1325  Weight: 168 lb (76.2 kg)     Objective:   Physical Exam Vitals and nursing note reviewed.  Constitutional:      General: He is not in acute distress. Neck:     Vascular: No JVD.  Cardiovascular:     Rate and Rhythm: Normal rate and regular rhythm.     Pulses: Normal pulses.     Heart sounds: Normal heart sounds. No murmur heard.   Pulmonary:     Effort: Pulmonary effort is normal.     Breath sounds: Normal breath sounds. No wheezing or rales.         Assessment & Recommendations:   65 y.o. African American male with hypertension, mixed hyperlipidemia,  h/o sickle cell trait, remote h/o alcohol abuse, referred for screening for CAD  No symptoms of angina/angina equivalent. No claudication. Normal vascular exam. Will obtain echocardiogram and calcium score scan for risk stratification. BP mildly elevated. Recommend monitoring for now. Encouraged to continue his smoking cessation efforts.  Further recommendations after above testing.   Thank you for referring the patient to [place]. Please feel free to contact with any questions.    Nigel Mormon, MD Pager: [phone] Office: [phone]

## 2020-03-09 ENCOUNTER — Other Ambulatory Visit: Payer: Self-pay

## 2020-03-09 ENCOUNTER — Ambulatory Visit: Payer: Medicare Other | Admitting: Cardiology

## 2020-03-09 ENCOUNTER — Encounter: Payer: Self-pay | Admitting: Cardiology

## 2020-03-09 VITALS — BP 144/89 | HR 72 | Temp 98.0°F | Resp 16 | Ht 72.0 in | Wt 168.0 lb

## 2020-03-09 DIAGNOSIS — I10 Essential (primary) hypertension: Secondary | ICD-10-CM | POA: Insufficient documentation

## 2020-03-09 DIAGNOSIS — E782 Mixed hyperlipidemia: Secondary | ICD-10-CM | POA: Insufficient documentation

## 2020-03-17 ENCOUNTER — Other Ambulatory Visit: Payer: Medicare Other

## 2020-03-23 LAB — CBC
Hematocrit: 37.4 % — ABNORMAL LOW (ref 37.5–51.0)
Hemoglobin: 12.4 g/dL — ABNORMAL LOW (ref 13.0–17.7)
MCH: 21.1 pg — ABNORMAL LOW (ref 26.6–33.0)
MCHC: 33.2 g/dL (ref 31.5–35.7)
MCV: 64 fL — ABNORMAL LOW (ref 79–97)
NRBC: 1 % — ABNORMAL HIGH (ref 0–0)
Platelets: 259 10*3/uL (ref 150–450)
RBC: 5.87 x10E6/uL — ABNORMAL HIGH (ref 4.14–5.80)
RDW: 18.4 % — ABNORMAL HIGH (ref 11.6–15.4)
WBC: 15.5 10*3/uL — ABNORMAL HIGH (ref 3.4–10.8)

## 2020-03-23 LAB — BASIC METABOLIC PANEL
BUN/Creatinine Ratio: 18 (ref 10–24)
BUN: 14 mg/dL (ref 8–27)
CO2: 22 mmol/L (ref 20–29)
Calcium: 10.1 mg/dL (ref 8.6–10.2)
Chloride: 100 mmol/L (ref 96–106)
Creatinine, Ser: 0.78 mg/dL (ref 0.76–1.27)
Glucose: 133 mg/dL — ABNORMAL HIGH (ref 65–99)
Potassium: 4 mmol/L (ref 3.5–5.2)
Sodium: 139 mmol/L (ref 134–144)
eGFR: 99 mL/min/{1.73_m2} (ref >59)

## 2020-03-23 LAB — LIPID PANEL
Chol/HDL Ratio: 3.9 ratio (ref 0.0–5.0)
Cholesterol, Total: 179 mg/dL (ref 100–199)
HDL: 46 mg/dL (ref >39)
LDL Chol Calc (NIH): 113 mg/dL — ABNORMAL HIGH (ref 0–99)
Triglycerides: 111 mg/dL (ref 0–149)
VLDL Cholesterol Cal: 20 mg/dL (ref 5–40)

## 2020-03-29 ENCOUNTER — Other Ambulatory Visit: Payer: Self-pay

## 2020-03-29 ENCOUNTER — Ambulatory Visit: Payer: Medicare Other

## 2020-03-29 DIAGNOSIS — I10 Essential (primary) hypertension: Secondary | ICD-10-CM

## 2020-04-07 ENCOUNTER — Other Ambulatory Visit (HOSPITAL_BASED_OUTPATIENT_CLINIC_OR_DEPARTMENT_OTHER): Payer: Self-pay

## 2020-04-11 ENCOUNTER — Encounter: Payer: Self-pay | Admitting: Cardiology

## 2020-04-11 ENCOUNTER — Other Ambulatory Visit: Payer: Self-pay

## 2020-04-11 ENCOUNTER — Ambulatory Visit: Payer: Medicare Other | Admitting: Cardiology

## 2020-04-11 VITALS — BP 116/82 | HR 75 | Temp 98.0°F | Resp 16 | Ht 72.0 in | Wt 166.0 lb

## 2020-04-11 DIAGNOSIS — E782 Mixed hyperlipidemia: Secondary | ICD-10-CM

## 2020-04-11 DIAGNOSIS — I10 Essential (primary) hypertension: Secondary | ICD-10-CM

## 2020-04-11 MED ORDER — ROSUVASTATIN CALCIUM 20 MG PO TABS: 20.0000 mg | ORAL_TABLET | Freq: Every day | ORAL | 3 refills | Status: DC

## 2020-04-11 NOTE — Progress Notes (Signed)
Patient referred by Javier Docker, MD for CAD screening  Subjective:   Xavier Campbell, male    DOB: 05/05/1955, 65 y.o.   MRN: [id number]   Chief Complaint  Patient presents with  . Hypertension  . Follow-up    4 WEEKS  . Results    echo     HPI  65 y.o. African American male with hypertension, mixed hyperlipidemia, h/o sickle cell trait, remote h/o alcohol abuse, referred for screening for CAD  No clear angina symptoms. CT cardiac scoring pending. Echocardiogram results reviewed with the patient, details below.   Initial consultation HPI 2/02022: Patient lives alone, is on disability due to back pain. He is still able to walk 1-2 miles without chest pain, shortness of breath, palpitations, leg edema, orthopnea, PND, TIA/syncope. He is trying to quit smoking and wears nicotine patch. He is down to 3 cigarettes/day.He quit drinking alcohol in 2009.    Current Outpatient Medications on File Prior to Visit  Medication Sig Dispense Refill  . amitriptyline (ELAVIL) 100 MG tablet Take 100 mg by mouth at bedtime.    . brimonidine-timolol (COMBIGAN) 0.2-0.5 % ophthalmic solution Place 1 drop into both eyes 2 (two) times daily.     Marland Kitchen HYDROcodone-acetaminophen (NORCO) 10-325 MG tablet Take 1 tablet by mouth every 4 (four) hours as needed.    . hydrocortisone (ANUSOL-HC) 2.5 % rectal cream Place 1 application rectally 2 (two) times daily. Apply around anus for irritated & painful hemorrhoids 15 g 2  . lisinopril (PRINIVIL,ZESTRIL) 10 MG tablet Take 10 mg by mouth every morning.     Marland Kitchen omeprazole (PRILOSEC) 20 MG capsule Take 20 mg by mouth every morning.     . zolpidem (AMBIEN) 10 MG tablet Take 10 mg by mouth at bedtime.     No current facility-administered medications on file prior to visit.    Cardiovascular and other pertinent studies:  Echocardiogram 03/29/2020:  Left ventricle cavity is normal in size. Mild concentric hypertrophy of  the left ventricle. Normal global  wall motion. Normal LV systolic function  with EF 59%. Doppler evidence of grade I (impaired) diastolic dysfunction,  normal LAP.  No evidence of pulmonary hypertension.  EKG 03/09/2020: Sinus rhythm 71 bpm Leftward axis Old anteroseptal infarct   Recent labs: 03/22/2020: Glucose 133, BUN/Cr 14/0.78. EGFR 99. Na/K 139/4.0.  H/H 12/37. MCV 64. Platelets 259 HbA1C N/A% Chol 179, TG 111, HDL 46, LDL 113 TSH N/A  06/26/2019: H/H 11.3/36.1. MCV 67.6. Platelets N/A   Review of Systems  Cardiovascular: Negative for chest pain, dyspnea on exertion, leg swelling, palpitations and syncope.  Musculoskeletal: Positive for back pain.       Vitals:   04/11/20 1351  BP: 116/82  Pulse: 75  Resp: 16  Temp: 98 F (36.7 C)  SpO2: 96%     Body mass index is 22.51 kg/m. Filed Weights   04/11/20 1351  Weight: 166 lb (75.3 kg)     Objective:   Physical Exam Vitals and nursing note reviewed.  Constitutional:      General: He is not in acute distress. Neck:     Vascular: No JVD.  Cardiovascular:     Rate and Rhythm: Normal rate and regular rhythm.     Pulses: Normal pulses.     Heart sounds: Normal heart sounds. No murmur heard.   Pulmonary:     Effort: Pulmonary effort is normal.     Breath sounds: Normal breath sounds. No wheezing or rales.  Assessment & Recommendations:   65 y.o. African American male with hypertension, mixed hyperlipidemia, h/o sickle cell trait, remote h/o alcohol abuse, mixed hyperlipidemia  Mixed hyperlipidemia: Added Crestor 20 mg daily. Repeat lipid panel in 3 months CT cardiac scoring pending.    F/u in 3 months   Nigel Mormon, MD Pager: [phone] Office: [phone]

## 2020-05-25 ENCOUNTER — Ambulatory Visit: Payer: Medicare Other | Admitting: Infectious Diseases

## 2020-05-31 ENCOUNTER — Ambulatory Visit (INDEPENDENT_AMBULATORY_CARE_PROVIDER_SITE_OTHER): Payer: Medicare Other | Admitting: Infectious Diseases

## 2020-05-31 ENCOUNTER — Other Ambulatory Visit: Payer: Self-pay

## 2020-05-31 ENCOUNTER — Encounter: Payer: Self-pay | Admitting: Infectious Diseases

## 2020-05-31 VITALS — BP 133/86 | HR 71 | Resp 16 | Ht 72.0 in | Wt 164.0 lb

## 2020-05-31 DIAGNOSIS — Z7185 Encounter for immunization safety counseling: Secondary | ICD-10-CM | POA: Diagnosis not present

## 2020-05-31 DIAGNOSIS — Z789 Other specified health status: Secondary | ICD-10-CM | POA: Insufficient documentation

## 2020-05-31 DIAGNOSIS — B182 Chronic viral hepatitis C: Secondary | ICD-10-CM | POA: Diagnosis not present

## 2020-05-31 DIAGNOSIS — Z7289 Other problems related to lifestyle: Secondary | ICD-10-CM | POA: Diagnosis not present

## 2020-05-31 NOTE — Progress Notes (Signed)
[facility]                                      [address], [place], [place], [address]                                               Phn. [phone]; Fax: [phone]                                                               Date: 05/31/20  Reason for Visit: Hepatitis C post tx follow up   HPI: Xavier Campbell is a 65 y.o.old male who is here for follow of Hep C treatment. He was started on Epclusa on 10/21 and has completed 12 weeks of tx. HCV VL at end of tx 02/24/20 was not detected.   05/31/20 Here for follow up post treatment of Hepatitis C for 3 months SVR check. Denies any nausea, vomiting, abdominal pain, distension. Appetite is good. No new complaints. He drinks beer occasionally here and there. Denies smoking and drugs.   ROS: Denies yellowish discoloration of sclera and skin, abdominal pain/distension, hematemesis.            Denies cough, fever, chills, nightsweats, nausea, vomiting, diarrhea, constipation, weight loss, recent hospitalizations, rashes  Current Outpatient Medications on File Prior to Visit  Medication Sig Dispense Refill  . amitriptyline (ELAVIL) 100 MG tablet Take 100 mg by mouth at bedtime.    . brimonidine-timolol (COMBIGAN) 0.2-0.5 % ophthalmic solution Place 1 drop into both eyes 2 (two) times daily.     Marland Kitchen HYDROcodone-acetaminophen (NORCO) 10-325 MG tablet Take 1 tablet by mouth every 4 (four) hours as needed.    . hydrocortisone (ANUSOL-HC) 2.5 % rectal cream Place 1 application rectally 2 (two) times daily. Apply around anus for irritated & painful hemorrhoids 15 g 2  . latanoprost (XALATAN) 0.005 % ophthalmic solution 1 drop at bedtime.    Marland Kitchen lisinopril (PRINIVIL,ZESTRIL) 10 MG tablet Take 10 mg by mouth every morning.     Marland Kitchen omeprazole (PRILOSEC) 20 MG capsule Take 20 mg by mouth every morning.     . rosuvastatin (CRESTOR) 20 MG tablet Take 1 tablet (20 mg total) by mouth  daily. 90 tablet 3  . zolpidem (AMBIEN) 10 MG tablet Take 10 mg by mouth at bedtime.     No current facility-administered medications on file prior to visit.    No Known Allergies  Past Medical History:  Diagnosis Date  . Alcoholism in remission [facility])    per pt treatment 10/ 2009 and has been remission since  . BPH (benign prostatic hyperplasia)   . Chronic back pain   . DDD (degenerative disc disease), cervical   . ED (erectile dysfunction) of organic origin   . Family history of sickle cell anemia    2 sisters  . Full dentures   . GERD (gastroesophageal reflux disease)   . Glaucoma, both eyes   . Grade III internal hemorrhoids    symptomatic  . History of adenomatous polyp of  colon    06-21-2016  tubular adenoma   . History of drug abuse ([place])    per pt treatment 10/ 2009 (crack) in remission since  . History of esophageal stricture    s/p  dilation 04-10-2011  . History of facial fracture 07/2007   left side multiple facial fx's involving nose, eye, jaw, cheek  . 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 Hyperlipidemia   . Hypertension   . Iron deficiency anemia   . Osteoarthritis   . Seasonal allergies   . Sickle cell trait [facility])    Past Surgical History:  Procedure Laterality Date  . ANTERIOR CERVICAL DECOMP/DISCECTOMY FUSION  09/13/2011   Procedure: ANTERIOR CERVICAL DECOMPRESSION/DISCECTOMY FUSION 1 LEVEL;  Surgeon: Otilio Connors, MD;  Location: [place] [facility];  Service: Neurosurgery;  Laterality: Bilateral;  Anterior Cervical Three-Four Decompression/Diskectomy and fusion  . ANTERIOR CERVICAL DECOMP/DISCECTOMY FUSION  05/23/2009   C4 -- C7  . APPENDECTOMY  age 73  . CATARACT EXTRACTION W/ INTRAOCULAR LENS  IMPLANT, BILATERAL  12/2015  . COLONOSCOPY  last one 06-21-2016  . ESOPHAGOGASTRODUODENOSCOPY  last one 04-10-2011  . HEMORRHOID SURGERY N/A 08/09/2016   Procedure: HEMORRHOIDECTOMY x 2, HEMORRHOIDAL LIGATION/PEXY, ANORECTAL  EXAMINATION UNDER ANESTHESIA , excision of anal canal mass;  Surgeon: Michael Boston, MD;  Location: [place] [facility];  Service: General;  Laterality: N/A;  GENERAL AND LOCAL ANESTHESIA   . LIPOMA EXCISION  10/29/2008   back   . LUMBAR LAMINECTOMY/DECOMPRESSION MICRODISCECTOMY Bilateral 01/25/2016   Procedure: Central decompression, lumbar laminectomy L3-L4 and L4-L5, and bilateral foraminotomy L4-L5;  Surgeon: Latanya Maudlin, MD;  Location: [facility];  Service: Orthopedics;  Laterality: Bilateral;  . ORBITAL FX REPAIR Left 2009   CLEARED FOR MRI.   Social History   Socioeconomic History  . Marital status: Single    Spouse name: Not on file  . Number of children: 2  . Years of education: Not on file  . Highest education level: Not on file  Occupational History  . Occupation: Unemployed  Tobacco Use  . Smoking status: Current Every Day Smoker    Packs/day: 0.25    Years: 46.00    Pack years: 11.50    Types: Cigarettes  . Smokeless tobacco: Never Used  . Tobacco comment: 1pp3d  Vaping Use  . Vaping Use: Never used  Substance and Sexual Activity  . Alcohol use: Yes    Comment: alcoholic -- treatment 28/ 2009 , per pt remission since  . Drug use: No    Comment: hx crack addict--  treatment 10/ 2009  per pt remission since   . Sexual activity: Not on file  Other Topics Concern  . Not on file  Social History Narrative   Daily caffeine   Social Determinants of Health   Financial Resource Strain: Not on file  Food Insecurity: Not on file  Transportation Needs: Not on file  Physical Activity: Not on file  Stress: Not on file  Social Connections: Not on file  Intimate Partner Violence: Not on file   Vitals  BP 133/86   Pulse 71   Resp 16   Ht 6' (1.829 m)   Wt 164 lb (74.4 kg)   SpO2 98%   BMI 22.24 kg/m   Gen: Alert and oriented x 3, no acute distress HEENT: [place]/AT, PERL, EOMI, no scleral icterus, no pale conjunctivae, hearing normal, oral mucosa moist Neck:  Supple, no lymphadenopathy Cardio: Regular rate and rhythm; +S1 and S2;  no murmurs, gallops, or rubs Resp: CTAB; no wheezes, rhonchi, or rales GI: Soft, nontender, nondistended, bowel sounds present GU: Musc: Extremities: No cyanosis, clubbing, or edema; +2 PT and DP pulses Skin: No rashes, lesions, or ecchymoses Neuro: No focal deficits Psych: Calm, cooperative  Assessment/Plan:  Problem List Items Addressed This Visit      Digestive   Chronic hepatitis C without hepatic coma ([place]) - Primary   Relevant Orders   Hepatitis C RNA quantitative     Other   Immunization counseling   Alcohol use      Hepatitis C S/p completion of 12 weeks of Epclusa HCV RNA today for SVR Fu will be made based on labs  Immunization Immune to Hepatitis A and vaccinated for Hepatitis B  Counseling Regarding risks of re-infection  Says he has been vaccinated with 2 doses of COVID including booster Limiting intake of acetaminophen and hepatotoxic medications   I spent 30 minutes with the patient including review of prior medical records with greater than 50% of time in face to face counsel of the patient.   Patient's labs were reviewed as well as his previous records. Patients questions were addressed and answered.   Electronically signed by:  Rosiland Oz, MD Infectious Diseases  Office phone [phone] Fax no. [phone]

## 2020-06-02 LAB — HEPATITIS C RNA QUANTITATIVE
HCV Quantitative Log: <1.18 log IU/mL
HCV RNA, PCR, QN: <15 IU/mL

## 2020-06-03 ENCOUNTER — Telehealth: Payer: Self-pay

## 2020-06-03 NOTE — Telephone Encounter (Signed)
-----   Message from Rosiland Oz, MD sent at 06/02/2020  5:04 PM EDT ----- Please let patient know he has cleared hepatitis c virus. HCV rna is negative

## 2020-06-03 NOTE — Telephone Encounter (Signed)
Contacted patient to relay test results (HCV cured). Informed him follow up is no longer needed unless he has concerns. No questions at this time.   Xavier Pyka Lorita Officer, RN

## 2020-07-13 ENCOUNTER — Encounter: Payer: Self-pay | Admitting: Cardiology

## 2020-07-13 ENCOUNTER — Other Ambulatory Visit: Payer: Self-pay

## 2020-07-13 ENCOUNTER — Ambulatory Visit: Payer: Medicare Other | Admitting: Cardiology

## 2020-07-13 VITALS — BP 97/62 | HR 72 | Temp 98.6°F | Resp 16 | Ht 72.0 in | Wt 163.0 lb

## 2020-07-13 DIAGNOSIS — I10 Essential (primary) hypertension: Secondary | ICD-10-CM

## 2020-07-13 DIAGNOSIS — E782 Mixed hyperlipidemia: Secondary | ICD-10-CM

## 2020-07-13 NOTE — Progress Notes (Signed)
Patient referred by Javier Docker, MD for CAD screening  Subjective:   Xavier Campbell, male    DOB: 08-05-1955, 65 y.o.   MRN: [id number]    Chief Complaint  Patient presents with   Hypertension   Follow-up    3 month   Results    labs     HPI  65 y.o. African American male with hypertension, mixed hyperlipidemia, h/o sickle cell trait, remote h/o alcohol abuse, referred for screening for CAD  No clear angina symptoms. CT cardiac scoring pending. Echocardiogram results reviewed with the patient, details below.   Initial consultation HPI 2/02022: Patient lives alone, is on disability due to back pain. He is still able to walk 1-2 miles without chest pain, shortness of breath, palpitations, leg edema, orthopnea, PND, TIA/syncope. He is trying to quit smoking and wears nicotine patch. He is down to 3 cigarettes/day.He quit drinking alcohol in 2009.    Current Outpatient Medications on File Prior to Visit  Medication Sig Dispense Refill   amitriptyline (ELAVIL) 100 MG tablet Take 100 mg by mouth at bedtime.     brimonidine-timolol (COMBIGAN) 0.2-0.5 % ophthalmic solution Place 1 drop into both eyes 2 (two) times daily.      HYDROcodone-acetaminophen (NORCO) 10-325 MG tablet Take 1 tablet by mouth every 4 (four) hours as needed.     hydrocortisone (ANUSOL-HC) 2.5 % rectal cream Place 1 application rectally 2 (two) times daily. Apply around anus for irritated & painful hemorrhoids 15 g 2   latanoprost (XALATAN) 0.005 % ophthalmic solution 1 drop at bedtime.     lisinopril (PRINIVIL,ZESTRIL) 10 MG tablet Take 10 mg by mouth every morning.      omeprazole (PRILOSEC) 20 MG capsule Take 20 mg by mouth every morning.      rosuvastatin (CRESTOR) 20 MG tablet Take 1 tablet (20 mg total) by mouth daily. 90 tablet 3   zolpidem (AMBIEN) 10 MG tablet Take 10 mg by mouth at bedtime.     No current facility-administered medications on file prior to visit.    Cardiovascular and  other pertinent studies:  Echocardiogram 03/29/2020:  Left ventricle cavity is normal in size. Mild concentric hypertrophy of  the left ventricle. Normal global wall motion. Normal LV systolic function  with EF 59%. Doppler evidence of grade I (impaired) diastolic dysfunction,  normal LAP.  No evidence of pulmonary hypertension.  EKG 03/09/2020: Sinus rhythm 71 bpm Leftward axis Old anteroseptal infarct  Recent labs: 03/22/2020: Glucose 133, BUN/Cr 14/0.78. EGFR 99. Na/K 139/4.0.  H/H 12/37. MCV 64. Platelets 259 HbA1C N/A% Chol 179, TG 111, HDL 46, LDL 113 TSH N/A  06/26/2019: H/H 11.3/36.1. MCV 67.6. Platelets N/A   Review of Systems  Cardiovascular:  Negative for chest pain, dyspnea on exertion, leg swelling, palpitations and syncope.      Vitals:   07/13/20 1333  BP: 97/62  Pulse: 72  Resp: 16  Temp: 98.6 F (37 C)  SpO2: 95%    Body mass index is 22.11 kg/m. Filed Weights   07/13/20 1333  Weight: 163 lb (73.9 kg)     Objective:   Physical Exam Vitals and nursing note reviewed.  Constitutional:      General: He is not in acute distress. Neck:     Vascular: No JVD.  Cardiovascular:     Rate and Rhythm: Normal rate and regular rhythm.     Heart sounds: Normal heart sounds. No murmur heard. Pulmonary:     Effort: Pulmonary  effort is normal.     Breath sounds: Normal breath sounds. No wheezing or rales.  Musculoskeletal:     Right lower leg: No edema.     Left lower leg: No edema.        Assessment & Recommendations:   65 y.o. African American male with hypertension, mixed hyperlipidemia, h/o sickle cell trait, remote h/o alcohol abuse, mixed hyperlipidemia  Mixed hyperlipidemia: Continue Crestor 20 mg daily. Check lipid panel now. CT cardiac scoring pending.   Primary hypertension: Controlled   F/u in 6 months   Nigel Mormon, MD Pager: [phone] Office: [phone]

## 2021-01-12 ENCOUNTER — Ambulatory Visit: Payer: Medicare Other | Admitting: Cardiology

## 2021-01-20 ENCOUNTER — Ambulatory Visit: Payer: Medicare Other | Admitting: Cardiology

## 2021-02-03 ENCOUNTER — Ambulatory Visit: Payer: Medicare Other | Admitting: Cardiology

## 2021-03-09 ENCOUNTER — Telehealth: Payer: Self-pay | Admitting: Cardiology

## 2021-03-22 ENCOUNTER — Emergency Department (HOSPITAL_COMMUNITY)
Admission: EM | Admit: 2021-03-22 | Discharge: 2021-03-22 | Disposition: A | Discharge status: Home / Self Care | Payer: Medicare Other | Attending: Emergency Medicine | Admitting: Emergency Medicine

## 2021-03-22 ENCOUNTER — Encounter (HOSPITAL_COMMUNITY): Payer: Self-pay | Admitting: *Deleted

## 2021-03-22 DIAGNOSIS — D649 Anemia, unspecified: Secondary | ICD-10-CM | POA: Diagnosis not present

## 2021-03-22 DIAGNOSIS — M79644 Pain in right finger(s): Secondary | ICD-10-CM | POA: Diagnosis present

## 2021-03-22 DIAGNOSIS — R739 Hyperglycemia, unspecified: Secondary | ICD-10-CM | POA: Insufficient documentation

## 2021-03-22 DIAGNOSIS — L03011 Cellulitis of right finger: Secondary | ICD-10-CM | POA: Insufficient documentation

## 2021-03-22 DIAGNOSIS — Z7984 Long term (current) use of oral hypoglycemic drugs: Secondary | ICD-10-CM | POA: Insufficient documentation

## 2021-03-22 DIAGNOSIS — L03019 Cellulitis of unspecified finger: Secondary | ICD-10-CM

## 2021-03-22 LAB — CBC WITH DIFFERENTIAL/PLATELET
Abs Immature Granulocytes: 0.01 10*3/uL (ref 0.00–0.07)
Basophils Absolute: 0 10*3/uL (ref 0.0–0.1)
Basophils Relative: 1 %
Eosinophils Absolute: 0.2 10*3/uL (ref 0.0–0.5)
Eosinophils Relative: 3 %
HCT: 27.8 % — ABNORMAL LOW (ref 39.0–52.0)
Hemoglobin: 9.4 g/dL — ABNORMAL LOW (ref 13.0–17.0)
Immature Granulocytes: 0 %
Lymphocytes Relative: 38 %
Lymphs Abs: 2.9 10*3/uL (ref 0.7–4.0)
MCH: 20.5 pg — ABNORMAL LOW (ref 26.0–34.0)
MCHC: 33.8 g/dL (ref 30.0–36.0)
MCV: 60.7 fL — ABNORMAL LOW (ref 80.0–100.0)
Monocytes Absolute: 0.5 10*3/uL (ref 0.1–1.0)
Monocytes Relative: 6 %
Neutro Abs: 4 10*3/uL (ref 1.7–7.7)
Neutrophils Relative %: 52 %
Platelets: 212 10*3/uL (ref 150–400)
RBC: 4.58 MIL/uL (ref 4.22–5.81)
RDW: 16.3 % — ABNORMAL HIGH (ref 11.5–15.5)
WBC: 7.6 10*3/uL (ref 4.0–10.5)
nRBC: 0.5 % — ABNORMAL HIGH (ref 0.0–0.2)

## 2021-03-22 LAB — URINALYSIS, ROUTINE W REFLEX MICROSCOPIC
Bacteria, UA: NONE SEEN
Bilirubin Urine: NEGATIVE
Glucose, UA: >=500 mg/dL — AB
Hgb urine dipstick: NEGATIVE
Ketones, ur: NEGATIVE mg/dL
Leukocytes,Ua: NEGATIVE
Nitrite: NEGATIVE
Protein, ur: NEGATIVE mg/dL
Specific Gravity, Urine: 1.03 (ref 1.005–1.030)
pH: 6 (ref 5.0–8.0)

## 2021-03-22 LAB — BASIC METABOLIC PANEL
Anion gap: 7 (ref 5–15)
BUN: 11 mg/dL (ref 8–23)
CO2: 25 mmol/L (ref 22–32)
Calcium: 9.1 mg/dL (ref 8.9–10.3)
Chloride: 101 mmol/L (ref 98–111)
Creatinine, Ser: 0.63 mg/dL (ref 0.61–1.24)
GFR, Estimated: >60 mL/min (ref >60)
Glucose, Bld: 459 mg/dL — ABNORMAL HIGH (ref 70–99)
Potassium: 3.9 mmol/L (ref 3.5–5.1)
Sodium: 133 mmol/L — ABNORMAL LOW (ref 135–145)

## 2021-03-22 LAB — CBG MONITORING, ED
Glucose-Capillary: 444 mg/dL — ABNORMAL HIGH (ref 70–99)
Glucose-Capillary: 201 mg/dL — ABNORMAL HIGH (ref 70–99)

## 2021-03-22 MED ORDER — SODIUM CHLORIDE 0.9 % IV BOLUS
1000.0000 mL | Freq: Once | INTRAVENOUS | Status: AC
  Administered 2021-03-22: 1000 mL via INTRAVENOUS

## 2021-03-22 MED ORDER — LIDOCAINE-EPINEPHRINE 2 %-1:200000 IJ SOLN
10.0000 mL | Freq: Once | INTRAMUSCULAR | Status: AC
Start: 2021-03-22 — End: 2021-03-22
  Administered 2021-03-22: 10 mL
  Filled 2021-03-22: qty 20

## 2021-03-22 MED ORDER — CEPHALEXIN 250 MG PO CAPS
500.0000 mg | ORAL_CAPSULE | Freq: Once | ORAL | Status: AC
Start: 2021-03-22 — End: 2021-03-22
  Administered 2021-03-22: 500 mg via ORAL
  Filled 2021-03-22: qty 2

## 2021-03-22 MED ORDER — DOXYCYCLINE HYCLATE 100 MG PO CAPS: 100.0000 mg | ORAL_CAPSULE | Freq: Two times a day (BID) | ORAL | 0 refills | Status: DC

## 2021-03-22 MED ORDER — CEPHALEXIN 500 MG PO CAPS: 500.0000 mg | ORAL_CAPSULE | Freq: Four times a day (QID) | ORAL | 0 refills | Status: DC

## 2021-03-22 MED ORDER — DOXYCYCLINE HYCLATE 100 MG PO TABS
100.0000 mg | ORAL_TABLET | Freq: Once | ORAL | Status: AC
  Administered 2021-03-22: 100 mg via ORAL
  Filled 2021-03-22: qty 1

## 2021-03-22 MED ORDER — BACITRACIN ZINC 500 UNIT/GM EX OINT
TOPICAL_OINTMENT | Freq: Two times a day (BID) | CUTANEOUS | Status: DC
  Administered 2021-03-22: 1 via TOPICAL
  Filled 2021-03-22: qty 0.9

## 2021-03-22 MED ORDER — METFORMIN HCL 500 MG PO TABS: 500.0000 mg | ORAL_TABLET | Freq: Every day | ORAL | 0 refills | Status: AC

## 2021-03-22 MED ORDER — INSULIN ASPART 100 UNIT/ML IJ SOLN: 8.0000 [IU] | Freq: Once | INTRAMUSCULAR | Status: DC

## 2021-03-22 NOTE — Discharge Instructions (Addendum)
You are seen in the ER for elevated blood sugar.  You could be having new diabetes.  We are prescribing you with metformin to start, but your primary care doctor will need to see you, give you proper diagnosis, give your diabetes education and start you on medications. ? ?You also had infection of your fingertip.  Infection from the pulp and also around your nail has been drained.  Start taking the antibiotics that are prescribed. ? ?Your primary care doctor can see you in the clinic and make sure that you are healing well.  If you start having worsening pain, swelling, redness, new pus drainage please return to the ER. ? ?The wound site needs to be kept clean and dry.  We recommend that you get at least couple rounds of warm water soaks every day for the next 2 or 3 days. ?

## 2021-03-22 NOTE — ED Provider Notes (Signed)
[facility] EMERGENCY DEPARTMENT Provider Note   CSN: [id number] Arrival date & time: 03/22/21  1411     History  Chief Complaint  Patient presents with   Hyperglycemia    Xavier Campbell is a 66 y.o. male.  HPI     Patient seen with the APP as a shared visit.  Essentially patient had elevated blood sugar noted by their PCP and sent to the ER for further evaluation.    Upon my assessment, patient also complained about pain over his finger over the right side that has been present for 1 week.  He does not have any history of diabetes.   Home Medications Prior to Admission medications   Medication Sig Start Date End Date Taking? Authorizing Provider  cephALEXin (KEFLEX) 500 MG capsule Take 1 capsule (500 mg total) by mouth 4 (four) times daily. 03/22/21  Yes Varney Biles, MD  doxycycline (VIBRAMYCIN) 100 MG capsule Take 1 capsule (100 mg total) by mouth 2 (two) times daily. 03/22/21  Yes Varney Biles, MD  metFORMIN (GLUCOPHAGE) 500 MG tablet Take 1 tablet (500 mg total) by mouth daily with breakfast. 03/22/21  Yes Silveria Botz, MD  amitriptyline (ELAVIL) 100 MG tablet Take 150 mg by mouth at bedtime.    [provider]  brimonidine-timolol (COMBIGAN) 0.2-0.5 % ophthalmic solution Place 1 drop into both eyes 2 (two) times daily.     [provider]  HYDROcodone-acetaminophen (NORCO) 10-325 MG tablet Take 1 tablet by mouth every 4 (four) hours as needed.    [provider]  latanoprost (XALATAN) 0.005 % ophthalmic solution 1 drop at bedtime. 05/09/20   [provider]  lisinopril (PRINIVIL,ZESTRIL) 10 MG tablet Take 10 mg by mouth every morning.     [provider]  omeprazole (PRILOSEC) 20 MG capsule Take 20 mg by mouth every morning.     [provider]  rosuvastatin (CRESTOR) 20 MG tablet Take 1 tablet (20 mg total) by mouth daily. 04/11/20 07/13/20  Patwardhan, Reynold Bowen, MD  zolpidem (AMBIEN) 10 MG  tablet Take 10 mg by mouth at bedtime.    [provider]      Allergies    Patient has no known allergies.    Review of Systems   Review of Systems  All other systems reviewed and are negative.  Physical Exam Updated Vital Signs BP (!) 147/89    Pulse 69    Temp 98.7 F (37.1 C) (Oral)    Resp 13    SpO2 100%  Physical Exam Vitals and nursing note reviewed.  Constitutional:      Appearance: He is well-developed.  HENT:     Head: Atraumatic.  Cardiovascular:     Rate and Rhythm: Normal rate.  Pulmonary:     Effort: Pulmonary effort is normal.  Musculoskeletal:     Cervical back: Neck supple.     Comments: Right hand index finger has significant swelling around the pulp with tenderness to palpation.  Patient also has significant swelling over the ulnar/lateral aspect of the nail fold of the index finger.  Tenderness to palpation.  Swelling limited to the distal tip of the finger only.  Skin:    General: Skin is warm.  Neurological:     Mental Status: He is alert and oriented to person, place, and time.    ED Results / Procedures / Treatments   Labs (all labs ordered are listed, but only abnormal results are displayed) Labs Reviewed  CBC WITH  DIFFERENTIAL/PLATELET - Abnormal; Notable for the following components:      Result Value   Hemoglobin 9.4 (*)    HCT 27.8 (*)    MCV 60.7 (*)    MCH 20.5 (*)    RDW 16.3 (*)    nRBC 0.5 (*)    All other components within normal limits  BASIC METABOLIC PANEL - Abnormal; Notable for the following components:   Sodium 133 (*)    Glucose, Bld 459 (*)    All other components within normal limits  URINALYSIS, ROUTINE W REFLEX MICROSCOPIC - Abnormal; Notable for the following components:   Glucose, UA >=500 (*)    All other components within normal limits  CBG MONITORING, ED - Abnormal; Notable for the following components:   Glucose-Capillary 444 (*)    All other components within normal limits  CBG MONITORING, ED -  Abnormal; Notable for the following components:   Glucose-Capillary 201 (*)    All other components within normal limits  BODY FLUID CULTURE W GRAM STAIN    EKG EKG Interpretation  Date/Time:  Wednesday March 22 2021 14:38:25 EST Ventricular Rate:  67 PR Interval:  174 QRS Duration: 88 QT Interval:  412 QTC Calculation: 435 R Axis:   -21 Text Interpretation: Normal sinus rhythm Septal infarct , age undetermined Abnormal ECG When compared with ECG of 18-Jan-2016 13:47, PREVIOUS ECG IS PRESENT No acute changes No significant change since last tracing Confirmed by Varney Biles [phone]) on 03/22/2021 6:56:19 PM  Radiology No results found.  Procedures .Nerve Block  Date/Time: 03/22/2021 11:18 PM Performed by: Varney Biles, MD Authorized by: Varney Biles, MD   Consent:    Consent obtained:  Verbal   Consent given by:  Patient   Risks, benefits, and alternatives were discussed: yes     Risks discussed:  Allergic reaction, infection, nerve damage, swelling, unsuccessful block and bleeding Universal protocol:    Procedure explained and questions answered to patient or proxy's satisfaction: yes     Patient identity confirmed:  Arm band Indications:    Indications:  Procedural anesthesia Location:    Body area:  Upper extremity   Upper extremity nerve:  Metacarpal   Laterality:  Right Pre-procedure details:    Skin preparation:  Chlorhexidine   Preparation: Patient was prepped and draped in usual sterile fashion   Procedure details:    Block needle gauge:  27 G   Anesthetic injected:  Lidocaine 2% WITH epi   Injection procedure:  Anatomic landmarks identified and anatomic landmarks palpated Post-procedure details:    Dressing:  Sterile dressing   Outcome:  Anesthesia achieved   Procedure completion:  Tolerated well, no immediate complications Drain paronychia  Date/Time: 03/22/2021 11:19 PM Performed by: Varney Biles, MD Authorized by: Varney Biles, MD   Consent: Verbal consent obtained. Risks and benefits: risks, benefits and alternatives were discussed Consent given by: patient Patient understanding: patient states understanding of the procedure being performed Required items: required blood products, implants, devices, and special equipment available Patient identity confirmed: arm band Time out: Immediately prior to procedure a "time out" was called to verify the correct patient, procedure, equipment, support staff and site/side marked as required. Preparation: Patient was prepped and draped in the usual sterile fashion. Patient tolerance: patient tolerated the procedure well with no immediate complications   .Marland KitchenIncision and Drainage  Date/Time: 03/22/2021 11:20 PM Performed by: Varney Biles, MD Authorized by: Varney Biles, MD   Consent:    Consent obtained:  Verbal   Consent  given by:  Patient   Risks, benefits, and alternatives were discussed: yes     Risks discussed:  Bleeding, incomplete drainage, pain, damage to other organs and infection   Alternatives discussed:  No treatment Universal protocol:    Procedure explained and questions answered to patient or proxy's satisfaction: yes     Patient identity confirmed:  Arm band Location:    Type:  Abscess   Location:  Upper extremity   Upper extremity location:  Finger   Finger location:  R index finger Pre-procedure details:    Skin preparation:  Chlorhexidine with alcohol Procedure type:    Complexity:  Complex Procedure details:    Ultrasound guidance: no     Incision types:  Single straight   Incision depth:  Subcutaneous   Wound management:  Probed and deloculated and irrigated with saline   Drainage:  Purulent and serosanguinous   Drainage amount:  Moderate   Wound treatment:  Wound left open Post-procedure details:    Procedure completion:  Tolerated well, no immediate complications Comments:     Felon drained in addition to paronychia of the same digit.     Medications Ordered in ED Medications  insulin aspart (novoLOG) injection 8 Units (8 Units Subcutaneous Not Given 03/22/21 2101)  lidocaine-EPINEPHrine (XYLOCAINE W/EPI) 2 %-1:200000 (PF) injection 10 mL (has no administration in time range)  bacitracin ointment (has no administration in time range)  cephALEXin (KEFLEX) capsule 500 mg (has no administration in time range)  doxycycline (VIBRA-TABS) tablet 100 mg (has no administration in time range)  sodium chloride 0.9 % bolus 1,000 mL (0 mLs Intravenous Stopped 03/22/21 2100)    ED Course/ Medical Decision Making/ A&P Clinical Course as of 03/22/21 2318  Wed Mar 22, 2021  1710 Glucose-Capillary(!): 444 [HK]  1710 Glucose(!): 459 [HK]  1710 Anion gap: 7 [HK]  1710 CO2: 25 [HK]    Clinical Course User Index [HK] Delia Heady, PA-C                           Medical Decision Making Amount and/or Complexity of Data Reviewed Labs: ordered. Decision-making details documented in ED Course.  Risk OTC drugs. Prescription drug management.   66 year old patient comes in with chief complaint of elevated blood sugar.  Appropriate labs were sent by APP.  No evidence of DKA.  Blood sugar improved with hydration only.  I spoke with on-call nurse for patient's PCP, they will make sure that patient gets prompt follow-up.  We will start him on metformin.  PCP can provide diabetes education and appropriate outpatient medication.  Additionally, patient had fingertip infection, both felon and paronychia.  Symptoms have been present for more than a week.  Still localized to the distal tip of the finger only.  Infection was drained from the pulp and also around the nailbed.  No complications.  We will start him on antibiotics and recommend warm soaks.  PCP follow-up to ensure that he is getting better.  He will return to the ER if his symptoms start getting worse.  Final Clinical Impression(s) / ED Diagnoses Final diagnoses:  Hyperglycemia   Anemia, unspecified type  Felon of finger  Paronychia of finger of right hand    Rx / DC Orders ED Discharge Orders          Ordered    doxycycline (VIBRAMYCIN) 100 MG capsule  2 times daily        03/22/21 2315  cephALEXin (KEFLEX) 500 MG capsule  4 times daily        03/22/21 2315    metFORMIN (GLUCOPHAGE) 500 MG tablet  Daily with breakfast        03/22/21 2316              Varney Biles, MD 03/22/21 2325

## 2021-03-22 NOTE — ED Provider Triage Note (Signed)
Emergency Medicine Provider Triage Evaluation Note ? ?Xavier Campbell , a 66 y.o. male  was evaluated in triage.  Pt complains of elevated blood sugar, over 500.  He reports that he has had some weight loss over the last few months, increased thirst, increased urination.  He has had some abdominal pain without significant nausea, vomiting.  He denies any chest pain, shortness of breath he also endorses a history of high blood pressure, high cholesterol.  He denies any vision changes.  He reports he has occasional tingling in his hands and toes. ? ?Review of Systems  ?Positive: Weight loss, polyuria, polydipsia, stomach pain ?Negative: Pain, shortness of breath, vision changes, confusion ? ?Physical Exam  ?BP 104/82 (BP Location: Right Arm)   Pulse 71   Temp 98.7 ?F (37.1 ?C) (Oral)   Resp 16   SpO2 100%  ?Gen:   Awake, no distress, patient is quite thin ?Resp:  Normal effort  ?MSK:   Moves extremities without difficulty  ?Other:  No significant TTP abdomen ? ?Medical Decision Making  ?Medically screening exam initiated at 2:20 PM.  Appropriate orders placed.  ARLO BUTT was informed that the remainder of the evaluation will be completed by another provider, this initial triage assessment does not replace that evaluation, and the importance of remaining in the ED until their evaluation is complete. ? ?Workup initiated ?  ?Anselmo Pickler, PA-C ?03/22/21 1422 ? ?

## 2021-03-22 NOTE — ED Notes (Signed)
CBG 201. Provider notified of the same and gave verbal orders not to administer insulin.  ?

## 2021-03-22 NOTE — ED Provider Notes (Signed)
?[facility] ?Provider Note ? ? ?CSN: [id number] ?Arrival date & time: 03/22/21  1411 ? ?  ? ?History ? ?Chief Complaint  ?Patient presents with  ? Hyperglycemia  ? ? ?Xavier Campbell is a 66 y.o. male presenting to the ED with a chief complaint of hyperglycemia.  Had blood work drawn at his PCPs office last week and was called today due to new onset of hyperglycemia.  He has been complaining of weight loss, generalized fatigue, polyuria and polydipsia for the past month which led to his diagnosis.  He states that he has never been told that he was diabetic.  He denies any chest pain, abdominal pain, vomiting, fever.  He was not started on any medication for presumed diabetes. ? ? ?Hyperglycemia ?Associated symptoms: fatigue, increased thirst and polyuria   ?Associated symptoms: no abdominal pain, no chest pain, no dizziness, no dysuria, no fever, no nausea, no shortness of breath, no vomiting and no weakness   ? ?  ? ?Home Medications ?Prior to Admission medications   ?Medication Sig Start Date End Date Taking? Authorizing Provider  ?amitriptyline (ELAVIL) 100 MG tablet Take 150 mg by mouth at bedtime.    [provider]  ?brimonidine-timolol (COMBIGAN) 0.2-0.5 % ophthalmic solution Place 1 drop into both eyes 2 (two) times daily.     [provider]  ?HYDROcodone-acetaminophen (NORCO) 10-325 MG tablet Take 1 tablet by mouth every 4 (four) hours as needed.    [provider]  ?latanoprost (XALATAN) 0.005 % ophthalmic solution 1 drop at bedtime. 05/09/20   [provider]  ?lisinopril (PRINIVIL,ZESTRIL) 10 MG tablet Take 10 mg by mouth every morning.     [provider]  ?omeprazole (PRILOSEC) 20 MG capsule Take 20 mg by mouth every morning.     [provider]  ?rosuvastatin (CRESTOR) 20 MG tablet Take 1 tablet (20 mg total) by mouth daily. 04/11/20 07/13/20  Patwardhan, Reynold Bowen, MD  ?zolpidem (AMBIEN) 10 MG tablet Take 10 mg by  mouth at bedtime.    [provider]  ?   ? ?Allergies    ?Patient has no known allergies.   ? ?Review of Systems   ?Review of Systems  ?Constitutional:  Positive for fatigue. Negative for appetite change, chills and fever.  ?HENT:  Negative for ear pain, rhinorrhea, sneezing and sore throat.   ?Eyes:  Negative for photophobia and visual disturbance.  ?Respiratory:  Negative for cough, chest tightness, shortness of breath and wheezing.   ?Cardiovascular:  Negative for chest pain and palpitations.  ?Gastrointestinal:  Negative for abdominal pain, blood in stool, constipation, diarrhea, nausea and vomiting.  ?Endocrine: Positive for polydipsia and polyuria.  ?Genitourinary:  Negative for dysuria, hematuria and urgency.  ?Musculoskeletal:  Negative for myalgias.  ?Skin:  Negative for rash.  ?Neurological:  Negative for dizziness, weakness and light-headedness.  ? ?Physical Exam ?Updated Vital Signs ?BP 104/60   Pulse 65   Temp 98.7 ?F (37.1 ?C) (Oral)   Resp 18   SpO2 98%  ?Physical Exam ?Vitals and nursing note reviewed.  ?Constitutional:   ?   General: He is not in acute distress. ?   Appearance: He is well-developed.  ?HENT:  ?   Head: Normocephalic and atraumatic.  ?   Nose: Nose normal.  ?Eyes:  ?   General: No scleral icterus.    ?   Right eye: No discharge.     ?   Left eye: No discharge.  ?  Conjunctiva/sclera: Conjunctivae normal.  ?Cardiovascular:  ?   Rate and Rhythm: Normal rate and regular rhythm.  ?   Heart sounds: Normal heart sounds. No murmur heard. ?  No friction rub. No gallop.  ?Pulmonary:  ?   Effort: Pulmonary effort is normal. No respiratory distress.  ?   Breath sounds: Normal breath sounds.  ?Abdominal:  ?   General: Bowel sounds are normal. There is no distension.  ?   Palpations: Abdomen is soft.  ?   Tenderness: There is no abdominal tenderness. There is no guarding.  ?Musculoskeletal:     ?   General: Normal range of motion.  ?   Cervical back: Normal range of motion and neck  supple.  ?Skin: ?   General: Skin is warm and dry.  ?   Findings: No rash.  ?Neurological:  ?   Mental Status: He is alert.  ?   Motor: No abnormal muscle tone.  ?   Coordination: Coordination normal.  ? ? ?ED Results / Procedures / Treatments   ?Labs ?(all labs ordered are listed, but only abnormal results are displayed) ?Labs Reviewed  ?CBC WITH DIFFERENTIAL/PLATELET - Abnormal; Notable for the following components:  ?    Result Value  ? Hemoglobin 9.4 (*)   ? HCT 27.8 (*)   ? MCV 60.7 (*)   ? MCH 20.5 (*)   ? RDW 16.3 (*)   ? nRBC 0.5 (*)   ? All other components within normal limits  ?BASIC METABOLIC PANEL - Abnormal; Notable for the following components:  ? Sodium 133 (*)   ? Glucose, Bld 459 (*)   ? All other components within normal limits  ?CBG MONITORING, ED - Abnormal; Notable for the following components:  ? Glucose-Capillary 444 (*)   ? All other components within normal limits  ?URINALYSIS, ROUTINE W REFLEX MICROSCOPIC  ? ? ?EKG ?None ? ?Radiology ?No results found. ? ?Procedures ?Procedures  ? ? ?Medications Ordered in ED ?Medications  ?sodium chloride 0.9 % bolus 1,000 mL (has no administration in time range)  ? ? ?ED Course/ Medical Decision Making/ A&P ?Clinical Course as of 03/22/21 1844  ?Wed Mar 22, 2021  ?1710 Glucose-Capillary(!): 444 [HK]  ?1710 Glucose(!): 459 [HK]  ?1710 Anion gap: 7 [HK]  ?1710 CO2: 25 [HK]  ?  ?Clinical Course User Index ?[HK] Shelly Coss, Tamee Battin, PA-C  ? ?                        ?Medical Decision Making ?Amount and/or Complexity of Data Reviewed ?Labs: ordered. Decision-making details documented in ED Course. ? ? ?66 year old male presenting to the ED for hypoglycemia.  This is new for him and had blood work drawn at his PCPs office last week and was called today with the results.  He does report polyuria, polydipsia, generalized fatigue and weight loss over the past month.  He denies any pain.  Glucose here is in the 450s.  No evidence of acidosis.  His hemoglobin is 9.4 which  is lower than his baseline.  There is no recent CBC drawn for recent comparison.  He denies any active bleeding, dark or tarry stools.  However his anemia could be a source of his generalized fatigue as well.  We will give IV fluids and recheck CBG. ?Care handed off to oncoming provider pending recheck. ? ? ?Portions of this note were generated with Lobby[employer]. Dictation errors may occur despite best attempts at proofreading. ? ? ? ? ? ? ? ? ?  Final Clinical Impression(s) / ED Diagnoses ?Final diagnoses:  ?Hyperglycemia  ?Anemia, unspecified type  ? ? ?Rx / DC Orders ?ED Discharge Orders   ? ? None  ? ?  ? ? ?  Delia Heady, PA-C ?03/22/21 1845 ? ?  ?Tegeler, Gwenyth Allegra, MD ?03/23/21 1615 ? ?

## 2021-03-22 NOTE — ED Notes (Signed)
Cultures collected by provider during I&D and sent to lab by this RN for analysis.  ?

## 2021-03-22 NOTE — ED Triage Notes (Addendum)
Pt arrived by [facility] from dr office. Reports recently had blood drawn on Monday and went for followup today due to hyperglycemia. Pt has no complaints other than generalized fatigue and excessive thirst. Sent here due to cbg >500 ?

## 2021-03-23 LAB — CBG MONITORING, ED: Glucose-Capillary: 453 mg/dL — ABNORMAL HIGH (ref 70–99)

## 2021-03-26 LAB — BODY FLUID CULTURE W GRAM STAIN: Gram Stain: NONE SEEN

## 2021-05-01 ENCOUNTER — Ambulatory Visit: Payer: Medicare Other | Admitting: Cardiology

## 2021-05-04 ENCOUNTER — Ambulatory Visit: Payer: Medicare Other | Admitting: Cardiology

## 2021-05-04 ENCOUNTER — Encounter: Payer: Self-pay | Admitting: Cardiology

## 2021-05-04 VITALS — BP 112/74 | HR 78 | Temp 98.0°F | Resp 16 | Ht 72.0 in | Wt 141.3 lb

## 2021-05-04 DIAGNOSIS — I10 Essential (primary) hypertension: Secondary | ICD-10-CM

## 2021-05-04 DIAGNOSIS — E782 Mixed hyperlipidemia: Secondary | ICD-10-CM

## 2021-05-04 NOTE — Progress Notes (Signed)
? ? ?Patient referred by Sonia Side., FNP for CAD screening ? ?Subjective:  ? ?Xavier Campbell, male    DOB: 1955/07/19, 66 y.o.   MRN: [id number] ? ? ? ?Chief Complaint  ?Patient presents with  ? Hypertension  ? Hyperlipidemia  ? Follow-up  ?  6 months  ? ? ? ?HPI ? ?66 y.o. African American male with hypertension, mixed hyperlipidemia, type 2 DM, h/o sickle cell trait, remote h/o alcohol abuse, referred for screening for CAD ? ?No clear angina symptoms. CT cardiac scoring pending. Lipid panel also pending. He is not on therapt for newly diagnosed diabetes.  ? ?Initial consultation HPI 2/02022: ?Patient lives alone, is on disability due to back pain. He is still able to walk 1-2 miles without chest pain, shortness of breath, palpitations, leg edema, orthopnea, PND, TIA/syncope. He is trying to quit smoking and wears nicotine patch. He is down to 3 cigarettes/day.He quit drinking alcohol in 2009.  ? ? ?Current Outpatient Medications on File Prior to Visit  ?Medication Sig Dispense Refill  ? amitriptyline (ELAVIL) 100 MG tablet Take 150 mg by mouth at bedtime.    ? brimonidine-timolol (COMBIGAN) 0.2-0.5 % ophthalmic solution Place 1 drop into both eyes 2 (two) times daily.     ? cephALEXin (KEFLEX) 500 MG capsule Take 1 capsule (500 mg total) by mouth 4 (four) times daily. 28 capsule 0  ? doxycycline (VIBRAMYCIN) 100 MG capsule Take 1 capsule (100 mg total) by mouth 2 (two) times daily. 14 capsule 0  ? HYDROcodone-acetaminophen (NORCO) 10-325 MG tablet Take 1 tablet by mouth every 4 (four) hours as needed.    ? latanoprost (XALATAN) 0.005 % ophthalmic solution 1 drop at bedtime.    ? lisinopril (PRINIVIL,ZESTRIL) 10 MG tablet Take 10 mg by mouth every morning.     ? metFORMIN (GLUCOPHAGE) 500 MG tablet Take 1 tablet (500 mg total) by mouth daily with breakfast. 14 tablet 0  ? omeprazole (PRILOSEC) 20 MG capsule Take 20 mg by mouth every morning.     ? rosuvastatin (CRESTOR) 20 MG tablet Take 1 tablet (20 mg  total) by mouth daily. 90 tablet 3  ? zolpidem (AMBIEN) 10 MG tablet Take 10 mg by mouth at bedtime.    ? ?No current facility-administered medications on file prior to visit.  ? ? ?Cardiovascular and other pertinent studies: ? ?EKG 03/22/2021: ?Sinus rhythm ?Anteroseptal infarct, age indeterminate ? ?Echocardiogram 03/29/2020:  ?Left ventricle cavity is normal in size. Mild concentric hypertrophy of  ?the left ventricle. Normal global wall motion. Normal LV systolic function  ?with EF 59%. Doppler evidence of grade I (impaired) diastolic dysfunction,  ?normal LAP.  ?No evidence of pulmonary hypertension. ? ?EKG 03/09/2020: ?Sinus rhythm 71 bpm ?Leftward axis ?Old anteroseptal infarct ? ?Recent labs: ?03/22/2020: ?Glucose 133, BUN/Cr 14/0.78. EGFR 99. Na/K 139/4.0.  ?H/H 12/37. MCV 64. Platelets 259 ?HbA1C N/A% ?Chol 179, TG 111, HDL 46, LDL 113 ?TSH N/A ? ?06/26/2019: ?H/H 11.3/36.1. MCV 67.6. Platelets N/A ? ? ?Review of Systems  ?Cardiovascular:  Negative for chest pain, dyspnea on exertion, leg swelling, palpitations and syncope.  ? ?   ?Vitals:  ? 05/04/21 1419  ?BP: 112/74  ?Pulse: 78  ?Resp: 16  ?Temp: 98 ?F (36.7 ?C)  ?SpO2: 96%  ? ? ?Body mass index is 19.16 kg/m?. Danley Danker Weights  ? 05/04/21 1419  ?Weight: 141 lb 4.8 oz (64.1 kg)  ? ? ? ?Objective:  ? Physical Exam ?Vitals and nursing note reviewed.  ?  Constitutional:   ?   General: He is not in acute distress. ?Neck:  ?   Vascular: No JVD.  ?Cardiovascular:  ?   Rate and Rhythm: Normal rate and regular rhythm.  ?   Heart sounds: Normal heart sounds. No murmur heard. ?Pulmonary:  ?   Effort: Pulmonary effort is normal.  ?   Breath sounds: Normal breath sounds. No wheezing or rales.  ?Musculoskeletal:  ?   Right lower leg: No edema.  ?   Left lower leg: No edema.  ? ? ?   ? ?Assessment & Recommendations:  ? ?66 y.o. African American male with hypertension, mixed hyperlipidemia, type 2 DM, h/o sickle cell trait, remote h/o alcohol abuse, referred for screening for  CAD ? ?Mixed hyperlipidemia: ?Continue Crestor 20 mg daily. Check lipid panel now. ? ?Primary hypertension: ?Controlled  ? ?F/u as needed ? ? ?Nigel Mormon, MD ?Pager: [phone] ?Office: [phone] ? ?

## 2021-05-14 ENCOUNTER — Other Ambulatory Visit: Payer: Self-pay | Admitting: Cardiology

## 2021-05-14 DIAGNOSIS — E782 Mixed hyperlipidemia: Secondary | ICD-10-CM

## 2021-07-13 ENCOUNTER — Encounter: Payer: Self-pay | Admitting: Gastroenterology

## 2021-07-27 ENCOUNTER — Encounter: Payer: Self-pay | Admitting: Gastroenterology

## 2021-08-15 ENCOUNTER — Ambulatory Visit (AMBULATORY_SURGERY_CENTER): Payer: Self-pay | Admitting: *Deleted

## 2021-08-15 VITALS — Ht 72.0 in | Wt 148.0 lb

## 2021-08-15 DIAGNOSIS — Z86010 Personal history of colonic polyps: Secondary | ICD-10-CM

## 2021-08-15 MED ORDER — NA SULFATE-K SULFATE-MG SULF 17.5-3.13-1.6 GM/177ML PO SOLN: 2.0000 | Freq: Once | ORAL | 0 refills | Status: AC

## 2021-08-15 NOTE — Progress Notes (Signed)
No egg or soy allergy known to patient  No issues known to pt with past sedation with any surgeries or procedures Patient denies ever being told they had issues or difficulty with intubation  No FH of Malignant Hyperthermia Pt is not on diet pills Pt is not on  home 02  Pt is not on blood thinners  Pt denies issues with constipation  No A fib or A flutter  PV completed in person. Pt verified name, DOB.  Procedure explained to pt. Prep instructions reviewed, questions answered. Pt encouraged to call with questions or issues.  If pt has My chart, procedure instructions sent via My Chart    

## 2021-08-31 ENCOUNTER — Other Ambulatory Visit: Payer: Self-pay | Admitting: Family Medicine

## 2021-08-31 DIAGNOSIS — R10811 Right upper quadrant abdominal tenderness: Secondary | ICD-10-CM

## 2021-09-04 ENCOUNTER — Inpatient Hospital Stay: Admission: RE | Admit: 2021-09-04 | Payer: Medicare Other | Source: Ambulatory Visit

## 2021-09-05 ENCOUNTER — Encounter: Payer: Self-pay | Admitting: Gastroenterology

## 2021-09-12 ENCOUNTER — Ambulatory Visit (AMBULATORY_SURGERY_CENTER): Payer: Medicare Other | Admitting: Gastroenterology

## 2021-09-12 ENCOUNTER — Encounter: Payer: Self-pay | Admitting: Gastroenterology

## 2021-09-12 VITALS — BP 110/63 | HR 60 | Temp 96.8°F | Resp 16 | Ht 72.0 in | Wt 148.0 lb

## 2021-09-12 DIAGNOSIS — Z09 Encounter for follow-up examination after completed treatment for conditions other than malignant neoplasm: Secondary | ICD-10-CM | POA: Diagnosis not present

## 2021-09-12 DIAGNOSIS — Z86010 Personal history of colonic polyps: Secondary | ICD-10-CM

## 2021-09-12 MED ORDER — SODIUM CHLORIDE 0.9 % IV SOLN: 500.0000 mL | Freq: Once | INTRAVENOUS | Status: DC

## 2021-09-12 NOTE — Op Note (Signed)
[facility] Patient Name: Xavier Campbell Procedure Date: 09/12/2021 3:10 PM MRN: [id number] Endoscopist: [place]. Loletha Carrow , MD Age: 66 Referring MD:  Date of Birth: [birth year] Gender: Male Account #: [id number] Procedure:                Colonoscopy Indications:              Surveillance: Personal history of adenomatous                            polyps on last colonoscopy 5 years ago                           diminutive TA x 17 June 2016                           patient subsequently had surgical removal of a                            hypertrophied anal papilla Medicines:                Monitored Anesthesia Care Procedure:                Pre-Anesthesia Assessment:                           - Prior to the procedure, a History and Physical                            was performed, and patient medications and                            allergies were reviewed. The patient's tolerance of                            previous anesthesia was also reviewed. The risks                            and benefits of the procedure and the sedation                            options and risks were discussed with the patient.                            All questions were answered, and informed consent                            was obtained. Prior Anticoagulants: The patient has                            taken no previous anticoagulant or antiplatelet                            agents. ASA Grade Assessment: III - A patient with  severe systemic disease. After reviewing the risks                            and benefits, the patient was deemed in                            satisfactory condition to undergo the procedure.                           After obtaining informed consent, the colonoscope                            was passed under direct vision. Throughout the                            procedure, the patient's blood pressure, pulse, and                             oxygen saturations were monitored continuously. The                            Olympus CF-HQ190L (Serial# [id number]) Colonoscope was                            introduced through the anus and advanced to the the                            cecum, identified by appendiceal orifice and                            ileocecal valve. The colonoscopy was somewhat                            difficult due to a redundant colon and significant                            looping. Successful completion of the procedure was                            aided by using an abdominal binder, manual pressure                            and straightening and shortening the scope to                            obtain bowel loop reduction. The patient tolerated                            the procedure well. The quality of the bowel                            preparation was poor. The ileocecal valve,  appendiceal orifice, and rectum were photographed.                            The bowel preparation used was SUPREP (same prep as                            prior procedure). Scope In: 3:22:34 PM Scope Out: 3:35:05 PM Scope Withdrawal Time: 0 hours 7 minutes 29 seconds  Total Procedure Duration: 0 hours 12 minutes 31 seconds  Findings:                 The perianal and digital rectal examinations were                            normal.                           Semi-liquid stool was found in the entire colon,                            interfering with visualization. There was a large                            amount of fibrous food debris with opaque liquid                            throughout the colon.                           A scar was found in the distal rectum. The scar                            tissue was healthy in appearance.                           The exam was otherwise without abnormality on                            direct and retroflexion views. Complications:            No  immediate complications. Estimated Blood Loss:     Estimated blood loss: none. Impression:               - Preparation of the colon was poor.                           - Stool in the entire examined colon.                           - Scar in the distal rectum.                           - The examination was otherwise normal on direct                            and retroflexion views.                           -  No specimens collected. Recommendation:           - Patient has a contact number available for                            emergencies. The signs and symptoms of potential                            delayed complications were discussed with the                            patient. Return to normal activities tomorrow.                            Written discharge instructions were provided to the                            patient.                           - Resume previous diet.                           - Continue present medications.                           - Repeat colonoscopy in 1 year for surveillance.                            For better quality prep on the next exam,                            split-dose golytely prep and closer attention to                            pre-procedure dietary restrictions re: fibrous                            foods. Dwayna Kentner L. Loletha Carrow, MD 09/12/2021 3:45:03 PM This report has been signed electronically.

## 2021-09-12 NOTE — Progress Notes (Unsigned)
History and Physical:  This patient presents for endoscopic testing for: Encounter Diagnosis  Name Primary?   Personal history of colonic polyps Yes    Diminutive TA x 17 June 2016 Patient denies chronic abdominal pain, rectal bleeding, constipation or diarrhea.   Patient is otherwise without complaints or active issues today.   Past Medical History: Past Medical History:  Diagnosis Date   Alcoholism in remission ([place])    per pt treatment 10/ 2009 and has been remission since   BPH (benign prostatic hyperplasia)    Chronic back pain    DDD (degenerative disc disease), cervical    ED (erectile dysfunction) of organic origin    Family history of sickle cell anemia    2 sisters   Full dentures    GERD (gastroesophageal reflux disease)    Glaucoma, both eyes    Grade III internal hemorrhoids    symptomatic   History of adenomatous polyp of colon    06-21-2016  tubular adenoma    History of drug abuse ([place])    per pt treatment 10/ 2009 (crack) in remission since   History of esophageal stricture    s/p  dilation 04-10-2011   History of facial fracture 07/2007   left side multiple facial fx's involving nose, eye, jaw, cheek   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 age 66's   Hyperlipidemia    Hypertension    Iron deficiency anemia    Osteoarthritis    Seasonal allergies    Sickle cell trait ([place])      Past Surgical History: Past Surgical History:  Procedure Laterality Date   ANTERIOR CERVICAL DECOMP/DISCECTOMY FUSION  09/13/2011   Procedure: ANTERIOR CERVICAL DECOMPRESSION/DISCECTOMY FUSION 1 LEVEL;  Surgeon: Otilio Connors, MD;  Location: [facility];  Service: Neurosurgery;  Laterality: Bilateral;  Anterior Cervical Three-Four Decompression/Diskectomy and fusion   ANTERIOR CERVICAL DECOMP/DISCECTOMY FUSION  05/23/2009   C4 -- C7   APPENDECTOMY  age 55   CATARACT EXTRACTION W/ INTRAOCULAR LENS  IMPLANT, BILATERAL  12/2015   COLONOSCOPY   last one 06-21-2016   ESOPHAGOGASTRODUODENOSCOPY  last one 04-10-2011   HEMORRHOID SURGERY N/A 08/09/2016   Procedure: HEMORRHOIDECTOMY x 2, HEMORRHOIDAL LIGATION/PEXY, ANORECTAL EXAMINATION UNDER ANESTHESIA , excision of anal canal mass;  Surgeon: Michael Boston, MD;  Location: [facility];  Service: General;  Laterality: N/A;  GENERAL AND LOCAL ANESTHESIA    LIPOMA EXCISION  10/29/2008   back    LUMBAR LAMINECTOMY/DECOMPRESSION MICRODISCECTOMY Bilateral 01/25/2016   Procedure: Central decompression, lumbar laminectomy L3-L4 and L4-L5, and bilateral foraminotomy L4-L5;  Surgeon: Latanya Maudlin, MD;  Location: [facility];  Service: Orthopedics;  Laterality: Bilateral;   ORBITAL FX REPAIR Left 2009   CLEARED FOR MRI.    Allergies: No Known Allergies  Outpatient Meds: Current Outpatient Medications  Medication Sig Dispense Refill   amitriptyline (ELAVIL) 100 MG tablet Take 150 mg by mouth at bedtime.     brimonidine-timolol (COMBIGAN) 0.2-0.5 % ophthalmic solution Place 1 drop into both eyes 2 (two) times daily.      HYDROcodone-acetaminophen (NORCO) 10-325 MG tablet Take 1 tablet by mouth every 4 (four) hours as needed.     JANUVIA 100 MG tablet Take 100 mg by mouth daily.     latanoprost (XALATAN) 0.005 % ophthalmic solution 1 drop at bedtime.     lisinopril (PRINIVIL,ZESTRIL) 10 MG tablet Take 10 mg by mouth every morning.      metFORMIN (GLUCOPHAGE) 500 MG tablet Take 1 tablet (  500 mg total) by mouth daily with breakfast. 14 tablet 0   omeprazole (PRILOSEC) 20 MG capsule Take 20 mg by mouth every morning.      rosuvastatin (CRESTOR) 20 MG tablet TAKE 1 TABLET(20 MG) BY MOUTH DAILY 90 tablet 3   zolpidem (AMBIEN) 10 MG tablet Take 10 mg by mouth at bedtime.     cephALEXin (KEFLEX) 500 MG capsule Take 1 capsule (500 mg total) by mouth 4 (four) times daily. (Patient not taking: Reported on 08/15/2021) 28 capsule 0   doxycycline (VIBRAMYCIN) 100 MG capsule Take 1 capsule (100 mg  total) by mouth 2 (two) times daily. (Patient not taking: Reported on 08/15/2021) 14 capsule 0   Current Facility-Administered Medications  Medication Dose Route Frequency Provider Last Rate Last Admin   0.9 %  sodium chloride infusion  500 mL Intravenous Once Nelida Meuse III, MD          ___________________________________________________________________ Objective   Exam:  BP 104/68   Pulse 67   Temp (!) 96.8 F (36 C)   Ht 6' (1.829 m)   Wt 148 lb (67.1 kg)   SpO2 97%   BMI 20.07 kg/m   CV: RRR without murmur, S1/S2 Resp: clear to auscultation bilaterally, normal RR and effort noted GI: soft, no tenderness, with active bowel sounds.   Assessment: Encounter Diagnosis  Name Primary?   Personal history of colonic polyps Yes     Plan: Colonoscopy  The benefits and risks of the planned procedure were described in detail with the patient or (when appropriate) their health care proxy.  Risks were outlined as including, but not limited to, bleeding, infection, perforation, adverse medication reaction leading to cardiac or pulmonary decompensation, pancreatitis (if ERCP).  The limitation of incomplete mucosal visualization was also discussed.  No guarantees or warranties were given.    The patient is appropriate for an endoscopic procedure in the ambulatory setting.   - Wilfrid Lund, MD

## 2021-09-12 NOTE — Patient Instructions (Signed)
YOU HAD AN ENDOSCOPIC PROCEDURE TODAY AT THE [facility]:   Refer to the procedure report that was given to you for any specific questions about what was found during the examination.  If the procedure report does not answer your questions, please call your gastroenterologist to clarify.  If you requested that your care partner not be given the details of your procedure findings, then the procedure report has been included in a sealed envelope for you to review at your convenience later.  YOU SHOULD EXPECT: Some feelings of bloating in the abdomen. Passage of more gas than usual.  Walking can help get rid of the air that was put into your GI tract during the procedure and reduce the bloating. If you had a lower endoscopy (such as a colonoscopy or flexible sigmoidoscopy) you may notice spotting of blood in your stool or on the toilet paper. If you underwent a bowel prep for your procedure, you may not have a normal bowel movement for a few days.  Please Note:  You might notice some irritation and congestion in your nose or some drainage.  This is from the oxygen used during your procedure.  There is no need for concern and it should clear up in a day or so.  SYMPTOMS TO REPORT IMMEDIATELY:  Following lower endoscopy (colonoscopy or flexible sigmoidoscopy):  Excessive amounts of blood in the stool  Significant tenderness or worsening of abdominal pains  Swelling of the abdomen that is new, acute  Fever of 100F or higher  For urgent or emergent issues, a gastroenterologist can be reached at any hour by calling [phone]. Do not use MyChart messaging for urgent concerns.    DIET:  We do recommend a small meal at first, but then you may proceed to your regular diet.  Drink plenty of fluids but you should avoid alcoholic beverages for 24 hours.  ACTIVITY:  You should plan to take it easy for the rest of today and you should NOT DRIVE or use heavy machinery until tomorrow (because of  the sedation medicines used during the test).    FOLLOW UP: Our staff will call the number listed on your records the next business day following your procedure.  We will call around 7:15- 8:00 am to check on you and address any questions or concerns that you may have regarding the information given to you following your procedure. If we do not reach you, we will leave a message.  If you develop any symptoms (ie: fever, flu-like symptoms, shortness of breath, cough etc.) before then, please call [phone].  If you test positive for Covid 19 in the 2 weeks post procedure, please call and report this information to us.    If any biopsies were taken you will be contacted by phone or by letter within the next 1-3 weeks.  Please call us at [phone] if you have not heard about the biopsies in 3 weeks.    SIGNATURES/CONFIDENTIALITY: You and/or your care partner have signed paperwork which will be entered into your electronic medical record.  These signatures attest to the fact that that the information above on your After Visit Summary has been reviewed and is understood.  Full responsibility of the confidentiality of this discharge information lies with you and/or your care-partner.  

## 2021-09-12 NOTE — Progress Notes (Signed)
Pt's states no medical or surgical changes since previsit or office visit. 

## 2021-09-12 NOTE — Progress Notes (Signed)
To pacu, VSS. Report to [email or web address] 

## 2021-09-13 ENCOUNTER — Telehealth: Payer: Self-pay | Admitting: *Deleted

## 2021-09-13 NOTE — Telephone Encounter (Signed)
Attempted f/u phone call. No answer. Left message. °

## 2021-09-21 ENCOUNTER — Ambulatory Visit
Admission: RE | Admit: 2021-09-21 | Discharge: 2021-09-21 | Disposition: A | Discharge status: Home / Self Care | Payer: Medicare Other | Source: Ambulatory Visit | Attending: Family Medicine | Admitting: Family Medicine

## 2021-09-21 DIAGNOSIS — R10811 Right upper quadrant abdominal tenderness: Secondary | ICD-10-CM

## 2021-11-23 ENCOUNTER — Other Ambulatory Visit (HOSPITAL_COMMUNITY): Payer: Self-pay

## 2021-11-23 MED ORDER — HYDROCODONE-ACETAMINOPHEN 10-325 MG PO TABS
0.5000 | ORAL_TABLET | Freq: Four times a day (QID) | ORAL | 0 refills | Status: DC | PRN
  Filled 2021-11-23 – 2021-11-24 (×2): qty 105, 30d supply, fill #0

## 2021-11-24 ENCOUNTER — Other Ambulatory Visit (HOSPITAL_COMMUNITY): Payer: Self-pay

## 2021-12-28 ENCOUNTER — Other Ambulatory Visit (HOSPITAL_COMMUNITY): Payer: Self-pay

## 2021-12-29 ENCOUNTER — Other Ambulatory Visit (HOSPITAL_COMMUNITY): Payer: Self-pay

## 2021-12-29 ENCOUNTER — Other Ambulatory Visit: Payer: Self-pay | Admitting: Adult Medicine

## 2021-12-29 DIAGNOSIS — Z122 Encounter for screening for malignant neoplasm of respiratory organs: Secondary | ICD-10-CM

## 2021-12-29 MED ORDER — HYDROCODONE-ACETAMINOPHEN 10-325 MG PO TABS
0.5000 | ORAL_TABLET | Freq: Four times a day (QID) | ORAL | 0 refills | Status: DC
  Filled 2021-12-29: qty 20, 5d supply, fill #0

## 2022-01-01 ENCOUNTER — Other Ambulatory Visit (HOSPITAL_COMMUNITY): Payer: Self-pay

## 2022-01-01 ENCOUNTER — Other Ambulatory Visit: Payer: Self-pay | Admitting: Cardiology

## 2022-01-01 DIAGNOSIS — E782 Mixed hyperlipidemia: Secondary | ICD-10-CM

## 2022-01-03 ENCOUNTER — Other Ambulatory Visit (HOSPITAL_COMMUNITY): Payer: Self-pay

## 2022-01-03 MED ORDER — HYDROCODONE-ACETAMINOPHEN 10-325 MG PO TABS
0.5000 | ORAL_TABLET | Freq: Four times a day (QID) | ORAL | 0 refills | Status: DC
  Filled 2022-01-05 (×2): qty 105, 30d supply, fill #0

## 2022-01-05 ENCOUNTER — Other Ambulatory Visit: Payer: Self-pay

## 2022-01-05 ENCOUNTER — Other Ambulatory Visit (HOSPITAL_COMMUNITY): Payer: Self-pay

## 2022-02-02 ENCOUNTER — Other Ambulatory Visit (HOSPITAL_COMMUNITY): Payer: Self-pay

## 2022-02-02 MED ORDER — HYDROCODONE-ACETAMINOPHEN 10-325 MG PO TABS
0.5000 | ORAL_TABLET | Freq: Four times a day (QID) | ORAL | 0 refills | Status: DC
  Filled 2022-02-02 – 2022-02-05 (×2): qty 105, 30d supply, fill #0

## 2022-02-05 ENCOUNTER — Other Ambulatory Visit (HOSPITAL_COMMUNITY): Payer: Self-pay

## 2022-03-06 ENCOUNTER — Other Ambulatory Visit (HOSPITAL_COMMUNITY): Payer: Self-pay

## 2022-03-06 MED ORDER — HYDROCODONE-ACETAMINOPHEN 10-325 MG PO TABS
0.5000 | ORAL_TABLET | Freq: Four times a day (QID) | ORAL | 0 refills | Status: DC | PRN
  Filled 2022-03-06: qty 105, 27d supply, fill #0
  Filled 2022-03-07: qty 105, 30d supply, fill #0

## 2022-03-07 ENCOUNTER — Other Ambulatory Visit (HOSPITAL_COMMUNITY): Payer: Self-pay

## 2022-03-08 ENCOUNTER — Other Ambulatory Visit: Payer: Self-pay

## 2022-03-08 ENCOUNTER — Other Ambulatory Visit (HOSPITAL_COMMUNITY): Payer: Self-pay

## 2022-03-09 ENCOUNTER — Other Ambulatory Visit (HOSPITAL_COMMUNITY): Payer: Self-pay

## 2022-03-22 ENCOUNTER — Other Ambulatory Visit (HOSPITAL_COMMUNITY): Payer: Self-pay

## 2022-04-05 ENCOUNTER — Other Ambulatory Visit (HOSPITAL_COMMUNITY): Payer: Self-pay

## 2022-04-05 MED ORDER — HYDROCODONE-ACETAMINOPHEN 10-325 MG PO TABS
0.5000 | ORAL_TABLET | Freq: Four times a day (QID) | ORAL | 0 refills | Status: DC
  Filled 2022-04-05 – 2022-04-09 (×2): qty 105, 30d supply, fill #0

## 2022-04-09 ENCOUNTER — Other Ambulatory Visit: Payer: Self-pay

## 2022-04-09 ENCOUNTER — Other Ambulatory Visit (HOSPITAL_COMMUNITY): Payer: Self-pay

## 2022-04-10 ENCOUNTER — Other Ambulatory Visit (HOSPITAL_COMMUNITY): Payer: Self-pay

## 2022-05-03 ENCOUNTER — Other Ambulatory Visit (HOSPITAL_COMMUNITY): Payer: Self-pay

## 2022-05-03 MED ORDER — HYDROCODONE-ACETAMINOPHEN 10-325 MG PO TABS
0.5000 | ORAL_TABLET | Freq: Four times a day (QID) | ORAL | 0 refills | Status: DC
  Filled 2022-05-03 – 2022-05-09 (×2): qty 15, 4d supply, fill #0

## 2022-05-09 ENCOUNTER — Other Ambulatory Visit (HOSPITAL_COMMUNITY): Payer: Self-pay

## 2022-05-11 ENCOUNTER — Other Ambulatory Visit (HOSPITAL_COMMUNITY): Payer: Self-pay

## 2022-05-12 ENCOUNTER — Other Ambulatory Visit (HOSPITAL_COMMUNITY): Payer: Self-pay

## 2022-05-12 MED ORDER — HYDROCODONE-ACETAMINOPHEN 10-325 MG PO TABS
0.5000 | ORAL_TABLET | Freq: Three times a day (TID) | ORAL | 0 refills | Status: DC
  Filled 2022-05-12: qty 15, 5d supply, fill #0

## 2022-05-14 ENCOUNTER — Other Ambulatory Visit (HOSPITAL_COMMUNITY): Payer: Self-pay

## 2022-05-14 ENCOUNTER — Other Ambulatory Visit: Payer: Self-pay

## 2022-05-17 ENCOUNTER — Other Ambulatory Visit (HOSPITAL_COMMUNITY): Payer: Self-pay

## 2022-05-17 MED ORDER — HYDROCODONE-ACETAMINOPHEN 10-325 MG PO TABS
1.0000 | ORAL_TABLET | Freq: Three times a day (TID) | ORAL | 0 refills | Status: DC
  Filled 2022-05-18: qty 90, 30d supply, fill #0

## 2022-05-18 ENCOUNTER — Other Ambulatory Visit (HOSPITAL_COMMUNITY): Payer: Self-pay

## 2022-06-13 ENCOUNTER — Encounter: Payer: Self-pay | Admitting: Gastroenterology

## 2022-06-15 ENCOUNTER — Other Ambulatory Visit (HOSPITAL_COMMUNITY): Payer: Self-pay

## 2022-06-15 MED ORDER — HYDROCODONE-ACETAMINOPHEN 10-325 MG PO TABS
0.5000 | ORAL_TABLET | Freq: Three times a day (TID) | ORAL | 0 refills | Status: DC | PRN
  Filled 2022-06-18: qty 90, 30d supply, fill #0

## 2022-06-18 ENCOUNTER — Other Ambulatory Visit (HOSPITAL_COMMUNITY): Payer: Self-pay

## 2022-07-12 ENCOUNTER — Other Ambulatory Visit (HOSPITAL_COMMUNITY): Payer: Self-pay

## 2022-07-12 MED ORDER — HYDROCODONE-ACETAMINOPHEN 10-325 MG PO TABS
1.0000 | ORAL_TABLET | Freq: Three times a day (TID) | ORAL | 0 refills | Status: DC
  Filled 2022-07-12 – 2022-07-16 (×2): qty 15, 5d supply, fill #0

## 2022-07-16 ENCOUNTER — Other Ambulatory Visit (HOSPITAL_COMMUNITY): Payer: Self-pay

## 2022-07-18 ENCOUNTER — Other Ambulatory Visit (HOSPITAL_COMMUNITY): Payer: Self-pay

## 2022-07-18 MED ORDER — HYDROCODONE-ACETAMINOPHEN 10-325 MG PO TABS
0.5000 | ORAL_TABLET | Freq: Three times a day (TID) | ORAL | 0 refills | Status: DC
  Filled 2022-07-23: qty 1, 1d supply, fill #0
  Filled 2022-07-23: qty 89, 29d supply, fill #0

## 2022-07-23 ENCOUNTER — Other Ambulatory Visit (HOSPITAL_COMMUNITY): Payer: Self-pay

## 2022-08-17 ENCOUNTER — Other Ambulatory Visit (HOSPITAL_COMMUNITY): Payer: Self-pay

## 2022-08-20 ENCOUNTER — Other Ambulatory Visit (HOSPITAL_COMMUNITY): Payer: Self-pay

## 2022-08-21 ENCOUNTER — Other Ambulatory Visit (HOSPITAL_COMMUNITY): Payer: Self-pay

## 2022-08-22 ENCOUNTER — Other Ambulatory Visit (HOSPITAL_COMMUNITY): Payer: Self-pay

## 2022-08-23 ENCOUNTER — Other Ambulatory Visit (HOSPITAL_COMMUNITY): Payer: Self-pay

## 2022-08-24 ENCOUNTER — Other Ambulatory Visit (HOSPITAL_COMMUNITY): Payer: Self-pay

## 2022-08-24 MED ORDER — HYDROCODONE-ACETAMINOPHEN 10-325 MG PO TABS
0.5000 | ORAL_TABLET | Freq: Three times a day (TID) | ORAL | 0 refills | Status: DC
  Filled 2022-08-24: qty 90, 30d supply, fill #0

## 2022-08-29 NOTE — Telephone Encounter (Signed)
Done

## 2022-09-19 ENCOUNTER — Ambulatory Visit (AMBULATORY_SURGERY_CENTER): Payer: Medicare HMO

## 2022-09-19 VITALS — Ht 72.0 in | Wt 150.0 lb

## 2022-09-19 DIAGNOSIS — Z86010 Personal history of colonic polyps: Secondary | ICD-10-CM

## 2022-09-19 MED ORDER — PEG 3350-KCL-NA BICARB-NACL 420 G PO SOLR
4000.0000 mL | Freq: Once | ORAL | 0 refills | Status: AC
Start: 2022-09-19 — End: 2022-09-19

## 2022-09-19 NOTE — Progress Notes (Signed)

## 2022-09-24 ENCOUNTER — Other Ambulatory Visit (HOSPITAL_COMMUNITY): Payer: Self-pay

## 2022-09-24 MED ORDER — HYDROCODONE-ACETAMINOPHEN 10-325 MG PO TABS
0.5000 | ORAL_TABLET | Freq: Three times a day (TID) | ORAL | 0 refills | Status: AC
Start: 2022-09-24 — End: ?
  Filled 2022-09-24: qty 90, 30d supply, fill #0

## 2022-09-29 ENCOUNTER — Encounter: Payer: Self-pay | Admitting: Certified Registered Nurse Anesthetist

## 2022-10-03 ENCOUNTER — Encounter: Payer: Self-pay | Admitting: Gastroenterology

## 2022-10-03 ENCOUNTER — Ambulatory Visit (AMBULATORY_SURGERY_CENTER): Payer: Medicare HMO | Admitting: Gastroenterology

## 2022-10-03 VITALS — BP 129/82 | HR 56 | Temp 98.4°F | Resp 12 | Ht 72.0 in | Wt 150.0 lb

## 2022-10-03 DIAGNOSIS — K6289 Other specified diseases of anus and rectum: Secondary | ICD-10-CM

## 2022-10-03 DIAGNOSIS — Z09 Encounter for follow-up examination after completed treatment for conditions other than malignant neoplasm: Secondary | ICD-10-CM

## 2022-10-03 DIAGNOSIS — D128 Benign neoplasm of rectum: Secondary | ICD-10-CM | POA: Diagnosis not present

## 2022-10-03 DIAGNOSIS — Z86010 Personal history of colonic polyps: Secondary | ICD-10-CM

## 2022-10-03 DIAGNOSIS — K621 Rectal polyp: Secondary | ICD-10-CM

## 2022-10-03 MED ORDER — SODIUM CHLORIDE 0.9 % IV SOLN: 500.0000 mL | Freq: Once | INTRAVENOUS | Status: DC

## 2022-10-03 NOTE — Progress Notes (Signed)
History and Physical:  This patient presents for endoscopic testing for: Encounter Diagnosis  Name Primary?   Personal history of colonic polyps Yes    Surveillance colonoscopy today. Hx adenomatous polyps June 2018.   Poor quality bowel prep Aug 2023 Patient denies chronic abdominal pain, rectal bleeding, constipation or diarrhea.   Patient is otherwise without complaints or active issues today.   Past Medical History: Past Medical History:  Diagnosis Date   Alcoholism in remission ([facility])    per pt treatment 10/ 2009 and has been remission since   BPH (benign prostatic hyperplasia)    Chronic back pain    DDD (degenerative disc disease), cervical    Diabetes mellitus without complication ([facility])    ED (erectile dysfunction) of organic origin    Family history of sickle cell anemia    2 sisters   Full dentures    GERD (gastroesophageal reflux disease)    Glaucoma, both eyes    Grade III internal hemorrhoids    symptomatic   History of adenomatous polyp of colon    06-21-2016  tubular adenoma    History of drug abuse ([facility])    per pt treatment 10/ 2009 (crack) in remission since   History of esophageal stricture    s/p  dilation 04-10-2011   History of facial fracture 07/2007   left side multiple facial fx's involving nose, eye, jaw, cheek   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 age 62's   Hyperlipidemia    Hypertension    Iron deficiency anemia    Osteoarthritis    Seasonal allergies    Sickle cell trait ([facility])      Past Surgical History: Past Surgical History:  Procedure Laterality Date   ANTERIOR CERVICAL DECOMP/DISCECTOMY FUSION  09/13/2011   Procedure: ANTERIOR CERVICAL DECOMPRESSION/DISCECTOMY FUSION 1 LEVEL;  Surgeon: Clydene Fake, MD;  Location: [facility];  Service: Neurosurgery;  Laterality: Bilateral;  Anterior Cervical Three-Four Decompression/Diskectomy and fusion   ANTERIOR CERVICAL DECOMP/DISCECTOMY FUSION  05/23/2009   C4  -- C7   APPENDECTOMY  age 11   CATARACT EXTRACTION W/ INTRAOCULAR LENS  IMPLANT, BILATERAL  12/2015   COLONOSCOPY  last one 06-21-2016   ESOPHAGOGASTRODUODENOSCOPY  last one 04-10-2011   HEMORRHOID SURGERY N/A 08/09/2016   Procedure: HEMORRHOIDECTOMY x 2, HEMORRHOIDAL LIGATION/PEXY, ANORECTAL EXAMINATION UNDER ANESTHESIA , excision of anal canal mass;  Surgeon: Karie Soda, MD;  Location: [facility];  Service: General;  Laterality: N/A;  GENERAL AND LOCAL ANESTHESIA    LIPOMA EXCISION  10/29/2008   back    LUMBAR LAMINECTOMY/DECOMPRESSION MICRODISCECTOMY Bilateral 01/25/2016   Procedure: Central decompression, lumbar laminectomy L3-L4 and L4-L5, and bilateral foraminotomy L4-L5;  Surgeon: Ranee Gosselin, MD;  Location: [facility];  Service: Orthopedics;  Laterality: Bilateral;   ORBITAL FX REPAIR Left 2009   CLEARED FOR MRI.    Allergies: No Known Allergies  Outpatient Meds: Current Outpatient Medications  Medication Sig Dispense Refill   amitriptyline (ELAVIL) 100 MG tablet Take 150 mg by mouth at bedtime.     brimonidine-timolol (COMBIGAN) 0.2-0.5 % ophthalmic solution Place 1 drop into both eyes 2 (two) times daily.      HYDROcodone-acetaminophen (NORCO) 10-325 MG tablet Take 0.5-1 tablets by mouth up to 3 (three) times daily. 90 tablet 0   JANUVIA 100 MG tablet Take 100 mg by mouth daily.     lisinopril (PRINIVIL,ZESTRIL) 10 MG tablet Take 10 mg by mouth every morning.      metFORMIN (GLUCOPHAGE) 500  MG tablet Take 1 tablet (500 mg total) by mouth daily with breakfast. 14 tablet 0   omeprazole (PRILOSEC) 20 MG capsule Take 20 mg by mouth every morning.      omeprazole (PRILOSEC) 40 MG capsule Take 40 mg by mouth daily.     rosuvastatin (CRESTOR) 20 MG tablet TAKE 1 TABLET BY MOUTH DAILY 100 tablet 2   Blood Glucose Monitoring Suppl (ACCU-CHEK GUIDE ME) w/Device KIT CHECK BLOOD SUGAR DAILY IN THE MORNING     latanoprost (XALATAN) 0.005 % ophthalmic solution 1 drop at  bedtime.     Travoprost, BAK Free, (TRAVATAN) 0.004 % SOLN ophthalmic solution SMARTSIG:In Eye(s)     zolpidem (AMBIEN) 10 MG tablet Take 10 mg by mouth at bedtime.     Current Facility-Administered Medications  Medication Dose Route Frequency Provider Last Rate Last Admin   0.9 %  sodium chloride infusion  500 mL Intravenous Once Sherrilyn Rist, MD          ___________________________________________________________________ Objective   Exam:  BP 110/71 (BP Location: Right Arm, Patient Position: Sitting, Cuff Size: Normal)   Pulse 63   Temp 98.4 F (36.9 C) (Temporal)   Ht 6' (1.829 m)   Wt 150 lb (68 kg)   SpO2 96%   BMI 20.34 kg/m   CV: regular , S1/S2 Resp: clear to auscultation bilaterally, normal RR and effort noted GI: soft, no tenderness, with active bowel sounds.   Assessment: Encounter Diagnosis  Name Primary?   Personal history of colonic polyps Yes     Plan: Colonoscopy   The benefits and risks of the planned procedure were described in detail with the patient or (when appropriate) their health care proxy.  Risks were outlined as including, but not limited to, bleeding, infection, perforation, adverse medication reaction leading to cardiac or pulmonary decompensation, pancreatitis (if ERCP).  The limitation of incomplete mucosal visualization was also discussed.  No guarantees or warranties were given.  The patient is appropriate for an endoscopic procedure in the ambulatory setting.   - Amada Jupiter, MD

## 2022-10-03 NOTE — Progress Notes (Signed)
Vitals-Xavier Campbell  Pt's states no medical or surgical changes since previsit or office visit.   Patient denies being a diabetic, yet he is on 2 meds.  CBG checked on admission.

## 2022-10-03 NOTE — Op Note (Signed)
[facility] Patient Name: Xavier Campbell Procedure Date: 10/03/2022 1:27 PM MRN: [id number] Endoscopist: Xavier Campbell , MD, [id number] Age: 67 Referring MD:  Date of Birth: 09-Oct-1955 Gender: Male Account #: [id number] Procedure:                Colonoscopy Indications:              Surveillance: Personal history of colonic polyps                            with unknown histology on last colonoscopy                            (inadequate bowel preparation) less than 3 years ago                           Diminutive tubular June 2018                           Subsequent surgical excision of hypertrophied anal                            papilla                           August 2023 no polyps, but poor preparation Medicines:                Monitored Anesthesia Care Procedure:                Pre-Anesthesia Assessment:                           - Prior to the procedure, a History and Physical                            was performed, and patient medications and                            allergies were reviewed. The patient's tolerance of                            previous anesthesia was also reviewed. The risks                            and benefits of the procedure and the sedation                            options and risks were discussed with the patient.                            All questions were answered, and informed consent                            was obtained. Prior Anticoagulants: The patient has  taken no anticoagulant or antiplatelet agents. ASA                            Grade Assessment: II - A patient with mild systemic                            disease. After reviewing the risks and benefits,                            the patient was deemed in satisfactory condition to                            undergo the procedure.                           After obtaining informed consent, the colonoscope                            was  passed under direct vision. Throughout the                            procedure, the patient's blood pressure, pulse, and                            oxygen saturations were monitored continuously. The                            [id number] Olympus Scope was introduced through the                            anus and advanced to the the cecum, identified by                            appendiceal orifice and ileocecal valve. The                            colonoscopy was performed with difficulty due to a                            redundant colon and significant looping. Successful                            completion of the procedure was aided by using                            manual pressure, straightening and shortening the                            scope to obtain bowel loop reduction and lavage.                            The patient tolerated the procedure well. The  quality of the bowel preparation was good after                            additional lavage. The ileocecal valve, appendiceal                            orifice, and rectum were photographed. The bowel                            preparation used was GoLYTELY via split dose                            instruction. Scope In: 1:29:28 PM Scope Out: 1:52:07 PM Scope Withdrawal Time: 0 hours 14 minutes 14 seconds  Total Procedure Duration: 0 hours 22 minutes 39 seconds  Findings:                 The perianal and digital rectal examinations were                            normal.                           Repeat examination of right colon under NBI                            performed.                           A few small-mouthed diverticula were found in the                            left colon.                           A scar was found in the distal rectum. There was a                            diminutive adjacent polypoid tissue prominence just                            above the dentate line.  This was biopsied off with                            a cold forceps for histology.                           The exam was otherwise without abnormality on                            direct and retroflexion views. Complications:            No immediate complications. Estimated Blood Loss:     Estimated blood loss was minimal. Impression:               - Diverticulosis in the left colon.                           -  Scar in the distal rectum. Biopsied.                           - The examination was otherwise normal on direct                            and retroflexion views. Recommendation:           - Patient has a contact number available for                            emergencies. The signs and symptoms of potential                            delayed complications were discussed with the                            patient. Return to normal activities tomorrow.                            Written discharge instructions were provided to the                            patient.                           - Resume previous diet.                           - Continue present medications.                           - Await pathology results.                           - Repeat colonoscopy is recommended for                            surveillance. The colonoscopy date will be                            determined after pathology results from today's                            exam become available for review. If distal rectal                            polypoid lesion is normal tissue or hyperplastic,                            repeat colonoscopy in 10 years. GoLytely prep for                            all future colonoscopies. Xavier Moffatt L. Myrtie Neither, MD 10/03/2022 2:00:00 PM This report has been signed electronically.

## 2022-10-03 NOTE — Progress Notes (Signed)
Called to room to assist during endoscopic procedure.  Patient ID and intended procedure confirmed with present staff. Received instructions for my participation in the procedure from the performing physician.  

## 2022-10-03 NOTE — Progress Notes (Signed)
Report given to PACU, vss 

## 2022-10-03 NOTE — Patient Instructions (Signed)

## 2022-10-04 ENCOUNTER — Telehealth: Payer: Self-pay

## 2022-10-04 NOTE — Telephone Encounter (Signed)
  Follow up Call-     10/03/2022   12:50 PM 10/03/2022   12:46 PM 09/12/2021    2:21 PM  Call back number  Post procedure Call Back phone  # [phone]  [phone]  Permission to leave phone message  Yes Yes   Follow up call, LVM

## 2022-10-08 LAB — SURGICAL PATHOLOGY

## 2022-10-09 ENCOUNTER — Encounter: Payer: Self-pay | Admitting: Gastroenterology

## 2022-10-24 ENCOUNTER — Other Ambulatory Visit (HOSPITAL_COMMUNITY): Payer: Self-pay

## 2022-10-24 MED ORDER — HYDROCODONE-ACETAMINOPHEN 10-325 MG PO TABS
0.5000 | ORAL_TABLET | Freq: Three times a day (TID) | ORAL | 0 refills | Status: AC | PRN
Start: 2022-10-24 — End: ?
  Filled 2022-10-24: qty 15, 5d supply, fill #0

## 2022-10-29 ENCOUNTER — Other Ambulatory Visit (HOSPITAL_COMMUNITY): Payer: Self-pay

## 2022-10-29 MED ORDER — HYDROCODONE-ACETAMINOPHEN 10-325 MG PO TABS
0.5000 | ORAL_TABLET | Freq: Three times a day (TID) | ORAL | 0 refills | Status: AC
Start: 2022-10-29 — End: ?
  Filled 2022-10-29: qty 90, 30d supply, fill #0

## 2022-10-30 ENCOUNTER — Other Ambulatory Visit (HOSPITAL_COMMUNITY): Payer: Self-pay

## 2022-11-29 ENCOUNTER — Other Ambulatory Visit (HOSPITAL_COMMUNITY): Payer: Self-pay

## 2022-11-29 MED ORDER — HYDROCODONE-ACETAMINOPHEN 10-325 MG PO TABS
0.5000 | ORAL_TABLET | Freq: Three times a day (TID) | ORAL | 0 refills | Status: AC | PRN
Start: 2022-11-29 — End: ?
  Filled 2022-11-29: qty 90, 30d supply, fill #0

## 2022-11-30 ENCOUNTER — Other Ambulatory Visit (HOSPITAL_COMMUNITY): Payer: Self-pay

## 2022-12-28 ENCOUNTER — Other Ambulatory Visit (HOSPITAL_COMMUNITY): Payer: Self-pay

## 2022-12-28 MED ORDER — HYDROCODONE-ACETAMINOPHEN 10-325 MG PO TABS
0.5000 | ORAL_TABLET | Freq: Three times a day (TID) | ORAL | 0 refills | Status: AC
Start: 2022-12-28 — End: ?
  Filled 2022-12-28 – 2022-12-31 (×2): qty 90, 30d supply, fill #0

## 2022-12-31 ENCOUNTER — Other Ambulatory Visit: Payer: Self-pay

## 2022-12-31 ENCOUNTER — Other Ambulatory Visit (HOSPITAL_COMMUNITY): Payer: Self-pay

## 2023-01-28 ENCOUNTER — Other Ambulatory Visit (HOSPITAL_COMMUNITY): Payer: Self-pay

## 2023-01-28 MED ORDER — HYDROCODONE-ACETAMINOPHEN 10-325 MG PO TABS
0.5000 | ORAL_TABLET | Freq: Three times a day (TID) | ORAL | 0 refills | Status: DC
Start: 2023-01-30 — End: 2023-03-05
  Filled 2023-01-30: qty 90, 30d supply, fill #0

## 2023-01-30 ENCOUNTER — Other Ambulatory Visit (HOSPITAL_COMMUNITY): Payer: Self-pay

## 2023-03-05 ENCOUNTER — Other Ambulatory Visit (HOSPITAL_COMMUNITY): Payer: Self-pay

## 2023-03-05 MED ORDER — HYDROCODONE-ACETAMINOPHEN 10-325 MG PO TABS
0.5000 | ORAL_TABLET | Freq: Three times a day (TID) | ORAL | 0 refills | Status: AC
  Filled 2023-03-05: qty 21, 7d supply, fill #0

## 2023-03-14 ENCOUNTER — Other Ambulatory Visit (HOSPITAL_COMMUNITY): Payer: Self-pay

## 2023-03-14 MED ORDER — HYDROCODONE-ACETAMINOPHEN 10-325 MG PO TABS
0.5000 | ORAL_TABLET | Freq: Three times a day (TID) | ORAL | 0 refills | Status: AC | PRN
Start: 2023-03-14 — End: ?
  Filled 2023-03-14: qty 15, 5d supply, fill #0

## 2023-03-15 ENCOUNTER — Other Ambulatory Visit (HOSPITAL_COMMUNITY): Payer: Self-pay

## 2023-03-19 ENCOUNTER — Other Ambulatory Visit (HOSPITAL_COMMUNITY): Payer: Self-pay

## 2023-03-19 MED ORDER — HYDROCODONE-ACETAMINOPHEN 10-325 MG PO TABS
0.5000 | ORAL_TABLET | Freq: Three times a day (TID) | ORAL | 0 refills | Status: AC
Start: 2023-03-19 — End: ?
  Filled 2023-03-19: qty 90, 30d supply, fill #0

## 2023-04-18 ENCOUNTER — Other Ambulatory Visit (HOSPITAL_COMMUNITY): Payer: Self-pay

## 2023-04-18 MED ORDER — HYDROCODONE-ACETAMINOPHEN 10-325 MG PO TABS
0.5000 | ORAL_TABLET | Freq: Three times a day (TID) | ORAL | 0 refills | Status: AC | PRN
Start: 2023-04-18 — End: ?
  Filled 2023-04-18: qty 90, 30d supply, fill #0

## 2023-05-17 ENCOUNTER — Other Ambulatory Visit (HOSPITAL_COMMUNITY): Payer: Self-pay

## 2023-05-17 MED ORDER — HYDROCODONE-ACETAMINOPHEN 10-325 MG PO TABS
0.5000 | ORAL_TABLET | Freq: Three times a day (TID) | ORAL | 0 refills | Status: DC
Start: 2023-05-17 — End: 2023-06-14
  Filled 2023-05-17: qty 90, 30d supply, fill #0

## 2023-05-20 ENCOUNTER — Other Ambulatory Visit (HOSPITAL_COMMUNITY): Payer: Self-pay

## 2023-06-14 ENCOUNTER — Other Ambulatory Visit (HOSPITAL_COMMUNITY): Payer: Self-pay

## 2023-06-14 MED ORDER — HYDROCODONE-ACETAMINOPHEN 10-325 MG PO TABS
0.5000 | ORAL_TABLET | Freq: Three times a day (TID) | ORAL | 0 refills | Status: AC
Start: 2023-06-17 — End: ?
  Filled 2023-06-14 – 2023-06-17 (×2): qty 90, 30d supply, fill #0

## 2023-06-17 ENCOUNTER — Other Ambulatory Visit: Payer: Self-pay

## 2023-06-17 ENCOUNTER — Other Ambulatory Visit (HOSPITAL_COMMUNITY): Payer: Self-pay

## 2023-07-16 ENCOUNTER — Other Ambulatory Visit (HOSPITAL_COMMUNITY): Payer: Self-pay

## 2023-07-16 MED ORDER — HYDROCODONE-ACETAMINOPHEN 10-325 MG PO TABS
0.5000 | ORAL_TABLET | Freq: Three times a day (TID) | ORAL | 0 refills | Status: AC | PRN
  Filled 2023-07-17: qty 90, 30d supply, fill #0

## 2023-07-17 ENCOUNTER — Other Ambulatory Visit (HOSPITAL_COMMUNITY): Payer: Self-pay

## 2023-08-16 ENCOUNTER — Other Ambulatory Visit (HOSPITAL_COMMUNITY): Payer: Self-pay

## 2023-08-16 MED ORDER — AMITRIPTYLINE HCL 150 MG PO TABS
150.0000 mg | ORAL_TABLET | Freq: Every day | ORAL | 1 refills | Status: AC
  Filled 2023-08-16: qty 90, 90d supply, fill #0

## 2023-08-16 MED ORDER — ROSUVASTATIN CALCIUM 20 MG PO TABS
20.0000 mg | ORAL_TABLET | Freq: Every day | ORAL | 1 refills | Status: AC
  Filled 2023-08-16: qty 90, 90d supply, fill #0

## 2023-08-16 MED ORDER — LISINOPRIL 10 MG PO TABS
10.0000 mg | ORAL_TABLET | Freq: Every day | ORAL | 1 refills | Status: AC
  Filled 2023-08-16: qty 90, 90d supply, fill #0

## 2023-08-16 MED ORDER — HYDROCODONE-ACETAMINOPHEN 10-325 MG PO TABS
0.5000 | ORAL_TABLET | Freq: Three times a day (TID) | ORAL | 0 refills | Status: DC
  Filled 2023-08-16: qty 90, 30d supply, fill #0

## 2023-08-16 MED ORDER — METFORMIN HCL 500 MG PO TABS
500.0000 mg | ORAL_TABLET | Freq: Two times a day (BID) | ORAL | 1 refills | Status: AC
  Filled 2023-08-16: qty 180, 90d supply, fill #0

## 2023-08-16 MED ORDER — JANUVIA 100 MG PO TABS
100.0000 mg | ORAL_TABLET | Freq: Every day | ORAL | 1 refills | Status: AC
  Filled 2023-08-16: qty 90, 90d supply, fill #0

## 2023-08-16 MED ORDER — OMEPRAZOLE 40 MG PO CPDR
40.0000 mg | DELAYED_RELEASE_CAPSULE | Freq: Every day | ORAL | 1 refills | Status: AC
  Filled 2023-08-16: qty 90, 90d supply, fill #0

## 2023-09-13 ENCOUNTER — Other Ambulatory Visit (HOSPITAL_COMMUNITY): Payer: Self-pay

## 2023-09-13 MED ORDER — HYDROCODONE-ACETAMINOPHEN 10-325 MG PO TABS
0.5000 | ORAL_TABLET | Freq: Three times a day (TID) | ORAL | 0 refills | Status: DC
  Filled 2023-09-13: qty 90, 30d supply, fill #0

## 2023-10-09 ENCOUNTER — Other Ambulatory Visit (HOSPITAL_COMMUNITY): Payer: Self-pay

## 2023-10-09 MED ORDER — ALBUTEROL SULFATE HFA 108 (90 BASE) MCG/ACT IN AERS
2.0000 | INHALATION_SPRAY | RESPIRATORY_TRACT | 0 refills | Status: DC | PRN
  Filled 2023-10-09: qty 6.7, 17d supply, fill #0

## 2023-10-14 ENCOUNTER — Other Ambulatory Visit (HOSPITAL_COMMUNITY): Payer: Self-pay

## 2023-10-15 ENCOUNTER — Other Ambulatory Visit (HOSPITAL_COMMUNITY): Payer: Self-pay

## 2023-10-15 MED ORDER — HYDROCODONE-ACETAMINOPHEN 10-325 MG PO TABS
0.5000 | ORAL_TABLET | Freq: Three times a day (TID) | ORAL | 0 refills | Status: AC
  Filled 2023-10-15: qty 90, 30d supply, fill #0

## 2023-11-14 ENCOUNTER — Other Ambulatory Visit (HOSPITAL_COMMUNITY): Payer: Self-pay

## 2023-11-14 MED ORDER — HYDROCODONE-ACETAMINOPHEN 10-325 MG PO TABS
0.5000 | ORAL_TABLET | Freq: Three times a day (TID) | ORAL | 0 refills | Status: AC | PRN
  Filled 2023-11-14: qty 90, 30d supply, fill #0

## 2023-12-13 ENCOUNTER — Other Ambulatory Visit (HOSPITAL_COMMUNITY): Payer: Self-pay

## 2023-12-13 MED ORDER — HYDROCODONE-ACETAMINOPHEN 10-325 MG PO TABS
0.5000 | ORAL_TABLET | Freq: Three times a day (TID) | ORAL | 0 refills | Status: AC
  Filled 2023-12-13 – 2023-12-16 (×2): qty 90, 30d supply, fill #0

## 2023-12-16 ENCOUNTER — Other Ambulatory Visit (HOSPITAL_COMMUNITY): Payer: Self-pay

## 2024-01-01 ENCOUNTER — Other Ambulatory Visit: Payer: Self-pay

## 2024-01-01 ENCOUNTER — Other Ambulatory Visit (HOSPITAL_COMMUNITY): Payer: Self-pay

## 2024-01-01 MED ORDER — ALBUTEROL SULFATE HFA 108 (90 BASE) MCG/ACT IN AERS
2.0000 | INHALATION_SPRAY | RESPIRATORY_TRACT | 0 refills | Status: AC | PRN
  Filled 2024-01-01: qty 6.7, 17d supply, fill #0

## 2024-01-01 MED ORDER — LISINOPRIL 10 MG PO TABS
10.0000 mg | ORAL_TABLET | Freq: Every day | ORAL | 1 refills | Status: AC
  Filled 2024-01-01: qty 90, 90d supply, fill #0

## 2024-01-01 MED ORDER — ROSUVASTATIN CALCIUM 20 MG PO TABS
20.0000 mg | ORAL_TABLET | Freq: Every day | ORAL | 1 refills | Status: AC
  Filled 2024-01-01: qty 90, 90d supply, fill #0

## 2024-01-01 MED ORDER — JANUVIA 100 MG PO TABS
100.0000 mg | ORAL_TABLET | Freq: Every day | ORAL | 1 refills | Status: AC
  Filled 2024-01-01: qty 90, 90d supply, fill #0

## 2024-01-01 MED ORDER — HYDROCODONE-ACETAMINOPHEN 10-325 MG PO TABS
0.5000 | ORAL_TABLET | Freq: Three times a day (TID) | ORAL | 0 refills | Status: DC | PRN
  Filled 2024-01-14 – 2024-01-15 (×2): qty 90, 30d supply, fill #0

## 2024-01-01 MED ORDER — METFORMIN HCL 500 MG PO TABS
500.0000 mg | ORAL_TABLET | Freq: Two times a day (BID) | ORAL | 1 refills | Status: AC
  Filled 2024-01-01: qty 180, 90d supply, fill #0

## 2024-01-01 MED ORDER — AMITRIPTYLINE HCL 150 MG PO TABS
150.0000 mg | ORAL_TABLET | Freq: Every day | ORAL | 1 refills | Status: AC
  Filled 2024-01-01: qty 30, 30d supply, fill #0

## 2024-01-01 MED ORDER — OMEPRAZOLE 40 MG PO CPDR
40.0000 mg | DELAYED_RELEASE_CAPSULE | Freq: Every day | ORAL | 1 refills | Status: AC
  Filled 2024-01-01: qty 90, 90d supply, fill #0

## 2024-01-13 ENCOUNTER — Other Ambulatory Visit (HOSPITAL_COMMUNITY): Payer: Self-pay

## 2024-01-14 ENCOUNTER — Other Ambulatory Visit (HOSPITAL_COMMUNITY): Payer: Self-pay

## 2024-01-15 ENCOUNTER — Other Ambulatory Visit (HOSPITAL_COMMUNITY): Payer: Self-pay

## 2024-02-20 ENCOUNTER — Other Ambulatory Visit (HOSPITAL_COMMUNITY): Payer: Self-pay

## 2024-02-20 MED ORDER — HYDROCODONE-ACETAMINOPHEN 10-325 MG PO TABS
0.5000 | ORAL_TABLET | Freq: Three times a day (TID) | ORAL | 0 refills | Status: AC | PRN
  Filled 2024-02-20: qty 90, 30d supply, fill #0
